# Patient Record
Sex: Female | Born: 1975 | Race: White | Hispanic: Yes | Smoking: Never smoker
Health system: Southern US, Community
[De-identification: ages and names within clinical notes are randomized; demographics above are authoritative.]

## PROBLEM LIST (undated history)

## (undated) DIAGNOSIS — K2970 Gastritis, unspecified, without bleeding: Secondary | ICD-10-CM

## (undated) HISTORY — PX: NO PAST SURGERIES: SHX2092

---

## 1998-07-13 ENCOUNTER — Encounter: Admission: RE | Admit: 1998-07-13 | Discharge: 1998-07-13 | Payer: Self-pay | Admitting: Family Medicine

## 1998-07-28 ENCOUNTER — Encounter: Admission: RE | Admit: 1998-07-28 | Discharge: 1998-07-28 | Payer: Self-pay | Admitting: Family Medicine

## 1998-08-26 ENCOUNTER — Encounter: Admission: RE | Admit: 1998-08-26 | Discharge: 1998-08-26 | Payer: Self-pay | Admitting: Family Medicine

## 2004-11-07 ENCOUNTER — Ambulatory Visit: Payer: Self-pay | Admitting: *Deleted

## 2004-11-08 ENCOUNTER — Ambulatory Visit: Payer: Self-pay | Admitting: Family Medicine

## 2004-11-08 ENCOUNTER — Inpatient Hospital Stay (HOSPITAL_COMMUNITY): Admission: AD | Admit: 2004-11-08 | Discharge: 2004-11-08 | Payer: Self-pay | Admitting: *Deleted

## 2004-11-21 ENCOUNTER — Ambulatory Visit: Payer: Self-pay | Admitting: *Deleted

## 2004-11-22 ENCOUNTER — Inpatient Hospital Stay (HOSPITAL_COMMUNITY): Admission: AD | Admit: 2004-11-22 | Discharge: 2004-11-22 | Payer: Self-pay | Admitting: *Deleted

## 2004-11-28 ENCOUNTER — Ambulatory Visit: Payer: Self-pay | Admitting: *Deleted

## 2004-12-05 ENCOUNTER — Ambulatory Visit: Payer: Self-pay | Admitting: *Deleted

## 2004-12-19 ENCOUNTER — Ambulatory Visit: Payer: Self-pay | Admitting: *Deleted

## 2004-12-26 ENCOUNTER — Ambulatory Visit: Payer: Self-pay | Admitting: *Deleted

## 2005-01-02 ENCOUNTER — Ambulatory Visit: Payer: Self-pay | Admitting: *Deleted

## 2005-01-05 ENCOUNTER — Ambulatory Visit: Payer: Self-pay | Admitting: *Deleted

## 2005-01-05 ENCOUNTER — Inpatient Hospital Stay (HOSPITAL_COMMUNITY): Admission: AD | Admit: 2005-01-05 | Discharge: 2005-01-08 | Payer: Self-pay | Admitting: *Deleted

## 2005-01-06 ENCOUNTER — Encounter (INDEPENDENT_AMBULATORY_CARE_PROVIDER_SITE_OTHER): Payer: Self-pay | Admitting: *Deleted

## 2008-05-07 ENCOUNTER — Inpatient Hospital Stay (HOSPITAL_COMMUNITY): Admission: AD | Admit: 2008-05-07 | Discharge: 2008-05-09 | Payer: Self-pay | Admitting: Obstetrics

## 2011-02-01 ENCOUNTER — Inpatient Hospital Stay (HOSPITAL_COMMUNITY)
Admission: EM | Admit: 2011-02-01 | Discharge: 2011-02-02 | DRG: 392 | Disposition: A | Discharge status: Home / Self Care | Payer: Medicaid Other | Attending: Surgery | Admitting: Surgery

## 2011-02-01 ENCOUNTER — Emergency Department (HOSPITAL_COMMUNITY): Payer: Medicaid Other

## 2011-02-01 DIAGNOSIS — K8020 Calculus of gallbladder without cholecystitis without obstruction: Secondary | ICD-10-CM | POA: Diagnosis present

## 2011-02-01 DIAGNOSIS — A048 Other specified bacterial intestinal infections: Secondary | ICD-10-CM | POA: Diagnosis present

## 2011-02-01 DIAGNOSIS — K2960 Other gastritis without bleeding: Principal | ICD-10-CM | POA: Diagnosis present

## 2011-02-01 LAB — DIFFERENTIAL
Basophils Absolute: 0 10*3/uL (ref 0.0–0.1)
Lymphocytes Relative: 31 % (ref 12–46)
Lymphs Abs: 2.8 10*3/uL (ref 0.7–4.0)
Monocytes Absolute: 0.8 10*3/uL (ref 0.1–1.0)
Monocytes Relative: 9 % (ref 3–12)
Neutro Abs: 5.1 10*3/uL (ref 1.7–7.7)

## 2011-02-01 LAB — COMPREHENSIVE METABOLIC PANEL
ALT: 24 U/L (ref 0–35)
Alkaline Phosphatase: 61 U/L (ref 39–117)
CO2: 26 mEq/L (ref 19–32)
Calcium: 8.8 mg/dL (ref 8.4–10.5)
GFR calc non Af Amer: >60 mL/min (ref >60)
Glucose, Bld: 109 mg/dL — ABNORMAL HIGH (ref 70–99)
Sodium: 138 mEq/L (ref 135–145)

## 2011-02-01 LAB — CBC
HCT: 36.7 % (ref 36.0–46.0)
Hemoglobin: 12.5 g/dL (ref 12.0–15.0)
MCHC: 34.1 g/dL (ref 30.0–36.0)

## 2011-02-01 LAB — LIPASE, BLOOD: Lipase: 28 U/L (ref 11–59)

## 2011-02-01 MED ORDER — TECHNETIUM TC 99M MEBROFENIN IV KIT
5.0000 | PACK | Freq: Once | INTRAVENOUS | Status: AC | PRN
  Administered 2011-02-01: 5 via INTRAVENOUS

## 2011-02-15 NOTE — Discharge Summary (Signed)
[name], [name]                 ACCOUNT NO.:  [id number]  MEDICAL RECORD NO.:  [id number]           PATIENT TYPE:  I  LOCATION:  5125                         FACILITY:  [facility]  PHYSICIAN:  Thornton Park. Daphine Deutscher, MD  DATE OF BIRTH:  02-16-1976  DATE OF ADMISSION:  02/01/2011 DATE OF DISCHARGE:  02/02/2011                              DISCHARGE SUMMARY   HISTORY OF PRESENT ILLNESS:  Katie [name] is a 35 year old Hispanic female who presented with complaint of intermittent abdominal pain that has been going on for actually several years, it has become more frequent recently and sometimes lasting for 3-4 days at a time.  This particular episode that brought her in seemed to start in the middle of the morning while laying in bed.  She states that food or eating does not precipitate or exacerbate her symptoms but she does state that lying flat for long periods of time does seem to exacerbate her symptoms.  She describes the pain as central but radiating around to the back on both sides.  She does report some symptoms related to reflux or indigestion. She denies smoking or tobacco history but Hispanic generally eats foods that are spicier in nature.  She presented to the emergency department where some workup was done including an ultrasound that showed evidence of a possible gallstone adherent in the gallbladder but no wall thickening or other evidence of cholecystitis.  Her labs were completely normal at the time of that workup.  Given her abnormal symptoms, the decision was made to admit the patient for ongoing workup and possible surgical intervention if proven to be biliary colic.  SUMMARY OF HOSPITAL COURSE:  The patient was admitted on February 01, 2011. Additional labs, including amylase and H. pylori, were sent off and a HIDA scan was ordered.  The HIDA scan showed prompt visualization and normal uptake the gallbladder and liver, normal excretion into the small intestine.  Incidentally,  her amylase came back normal; however, her H. pylori titer was positive at 1.22.  Therefore, it had been determined that the patient was probably suffering from an H. pylori gastritis or early ulcerative disease.  She tolerated bowel rest and n.p.o. x24 hours but was started on a low-fat liquid diet here in the hospital which she tolerated well.  At this point, I feel the patient is appropriate to discharge and begin triple medication treatment for H. pylori.  DISCHARGE DIAGNOSIS:  Abdominal pain secondary to Helicobacter pylori gastritis.  DISCHARGE MEDICATIONS: 1. Prilosec 20 mg twice daily. 2. Amoxicillin 500 mg two tablets twice daily. 3. Clarithromycin 500 mg one tablet twice daily.  She is given instructions regarding dietary restrictions, and she is asked to follow up either with our office or her primary care office in the following 2 weeks should her symptoms not resolve.  I have asked her after her completion of treatment, she will likely need to maintain herself on Prilosec or Pepcid over the counter for maintenance.  She also may need eventual referral to Gastroenterology for endoscopic evaluation.     [name], PA-C   ______________________________ Thornton Park Daphine Deutscher, MD  KB/MEDQ  D:  02/02/2011  T:  02/03/2011  Job:  [id number]  Electronically Signed by [name]  on 02/05/2011 02:10:54 PM Electronically Signed by Luretha Murphy MD on 02/15/2011 08:37:18 AM

## 2011-02-15 NOTE — H&P (Signed)
Katie [name], Katie [name]                 ACCOUNT NO.:  [id number]  MEDICAL RECORD NO.:  [id number]           PATIENT TYPE:  E  LOCATION:  [facility]                         FACILITY:  [facility]  PHYSICIAN:  Thornton Park. Daphine Deutscher, MD  DATE OF BIRTH:  [birth year]  DATE OF ADMISSION:  02/01/2011 DATE OF DISCHARGE:                             HISTORY & PHYSICAL   PRIMARY CARE PHYSICIAN:  None.  CHIEF COMPLAINT:  Abdominal pain.  HISTORY OF PRESENT ILLNESS:  Katie [name] is a 35 year old Hispanic female who reports intermittent history of upper abdominal pain actually going on for several years.  For the most part, it is on an intermittent basis several months apart, her most recent bout of this was about 3 weeks ago when the pain came to the upper abdomen, most of the time bothering her in the early morning hours.  This lasted for about 4 days before resolving on its own.  She has had no prior workup for this discomfort. She was awoken again early morning with complaint of upper abdominal and epigastric pain associated with some nausea and vomiting.  She cannot correlate any worsening of these symptoms or precipitated symptoms by eating.  She denies any fever.  She denies any chills.  Denies any chest pain or shortness of breath.  She states the pain does radiate around to her back describing beginning in the central portion of her abdomen, radiating round both sides equally.  She does have some reflux symptoms and states the pain does seem to be worse when lying flat.  She otherwise denies any changes in her stool since diarrhea, constipation or hematochezia or melena.  She has had no prior knowledge of any chronic abdominal disorders.  We have been asked to see the patient and she came into the emergency department this morning her pain has persisted and her ultrasound findings show what could possibly represent a stone in the gallbladder.  Therefore, there is concern for biliary colic and we are asked  to see this patient.  PAST MEDICAL HISTORY:  Negative.  PAST SURGICAL HISTORY:  Negative.  FAMILY HISTORY:  Noncontributory at the present case.  SOCIAL HISTORY:  The patient is married, has one child.  She denies any alcohol, tobacco or illicit drug use.  She denies any chronic nonsteroidal or aspirin therapy.  DRUG ALLERGIES:  No known drug or latex allergies.  MEDICATIONS:  None.  REVIEW OF SYSTEMS:  Please see history of present illness for pertinent findings, otherwise, complete 10 system review negative.  PHYSICAL EXAMINATION:  GENERAL:  A 35 year old obese Hispanic female. VITAL SIGNS:  Temperature of 98.0, heart rate of 70, respiratory rate of 20, blood pressure 120/80. ENT:  Unremarkable. NECK:  Supple with no lymphadenopathy.  Trachea is midline.  No thyromegaly or masses. LUNGS:  Clear to auscultation.  No wheezes, rhonchi or rales.  Normal respiratory effort without use of accessory muscles. HEART:  Regular rate and rhythm.  No murmurs, gallops or rubs.  Carotids 2+ brisk without bruit.  Peripheral pulses intact and symmetrical. ABDOMEN:  Soft, obese but nondistended.  There is no mass effect.  No hernias.  No organomegaly.  The patient is tender in the epigastrium and right upper quadrant without rebound or peritonitis. RECTAL:  Deferred.  Good active range of motion in all extremities without crepitus or pain.  Normal muscle strength and tone without atrophy. SKIN:  Otherwise, warm and dry.  No jaundice.  Normal turgor.  No rashes. NEUROLOGIC:  The patient is alert and oriented x3.  DIAGNOSTICS:  Pertinent lab findings showed a white blood cell count of 8.9.  Metabolic panel shows electrolytes within normal limits.  Liver enzymes including lipase also within normal limits.  Ultrasound showed no gallbladder wall thickening or pericholecystic fluid.  Gallbladder is borderline, distended.  Murphy sign is negative.  There is a 6-mm immobile focus on gallbladder  wall, possibly representing an adhering gallstone.  IMPRESSION:  Abdominal pain suspicious for biliary colic or chronic cholecystitis though her symptoms are not classic and could also represent peptic ulcer disease.  PLAN:  We will admit the patient, repeat some labs including an amylase and an H. pylori titer.  Question the need for HIDA scan versus Gastroenterology consult.  We also discussed with the attending physician, Dr. Luretha Murphy regarding the possibility of a cholecystectomy this admission.     [name], PA-C   ______________________________ Thornton Park Daphine Deutscher, MD    KB/MEDQ  D:  02/01/2011  T:  02/02/2011  Job:  [id number]  Electronically Signed by [name]  on 02/05/2011 02:10:49 PM Electronically Signed by Luretha Murphy MD on 02/15/2011 08:37:20 AM

## 2011-07-13 LAB — RPR: RPR Ser Ql: NONREACTIVE

## 2011-07-13 LAB — CBC
HCT: 33.8 — ABNORMAL LOW
Hemoglobin: 11.3 — ABNORMAL LOW
MCHC: 33.5
MCHC: 33.6
MCV: 93.6
MCV: 94.7
Platelets: 188
RBC: 3.57 — ABNORMAL LOW
RDW: 14.2
WBC: 10.1

## 2012-05-27 ENCOUNTER — Inpatient Hospital Stay (HOSPITAL_COMMUNITY)
Admission: AD | Admit: 2012-05-27 | Discharge: 2012-05-27 | Disposition: A | Discharge status: Home / Self Care | Payer: Medicaid Other | Source: Ambulatory Visit | Attending: Obstetrics & Gynecology | Admitting: Obstetrics & Gynecology

## 2012-05-27 ENCOUNTER — Encounter (HOSPITAL_COMMUNITY): Payer: Self-pay | Admitting: *Deleted

## 2012-05-27 ENCOUNTER — Inpatient Hospital Stay (HOSPITAL_COMMUNITY): Payer: Medicaid Other

## 2012-05-27 DIAGNOSIS — O039 Complete or unspecified spontaneous abortion without complication: Secondary | ICD-10-CM

## 2012-05-27 LAB — CBC
HCT: 37.8 % (ref 36.0–46.0)
MCH: 30.7 pg (ref 26.0–34.0)
MCHC: 34.1 g/dL (ref 30.0–36.0)
MCV: 90 fL (ref 78.0–100.0)
Platelets: 256 10*3/uL (ref 150–400)
RDW: 13.2 % (ref 11.5–15.5)

## 2012-05-27 LAB — POCT PREGNANCY, URINE: Preg Test, Ur: POSITIVE — AB

## 2012-05-27 LAB — WET PREP, GENITAL
Clue Cells Wet Prep HPF POC: NONE SEEN
Trich, Wet Prep: NONE SEEN
Yeast Wet Prep HPF POC: NONE SEEN

## 2012-05-27 MED ORDER — MISOPROSTOL 200 MCG PO TABS: ORAL_TABLET | ORAL | Status: DC

## 2012-05-27 MED ORDER — OXYCODONE-ACETAMINOPHEN 5-325 MG PO TABS: 1.0000 | ORAL_TABLET | ORAL | Status: AC | PRN

## 2012-05-27 NOTE — MAU Provider Note (Signed)
History     CSN: [id number]  Arrival date and time: 05/27/12 1223   None     Chief Complaint  Patient presents with  . Vaginal Bleeding   HPI 37 y.o. [id number] with bleeding x 3 days, spotting last 2 days, heavy with clots and cramping today. Patient's last menstrual period was 03/30/2012. Pt has not taken home UPT.    History reviewed. No pertinent past medical history.  History reviewed. No pertinent past surgical history.  History reviewed. No pertinent family history.  History  Substance Use Topics  . Smoking status: Never Smoker   . Smokeless tobacco: Not on file  . Alcohol Use: No    Allergies: No Known Allergies  Prescriptions prior to admission  Medication Sig Dispense Refill  . ibuprofen (ADVIL,MOTRIN) 200 MG tablet Take 400 mg by mouth once. For pain        Review of Systems  Constitutional: Negative.   Respiratory: Negative.   Cardiovascular: Negative.   Gastrointestinal: Negative for nausea, vomiting, abdominal pain, diarrhea and constipation.  Genitourinary: Negative for dysuria, urgency, frequency, hematuria and flank pain.       Negative vaginal discharge, Positive for vaginal bleeding  Musculoskeletal: Negative.   Neurological: Negative.   Psychiatric/Behavioral: Negative.    Physical Exam   Blood pressure 115/67, pulse 76, temperature 97.8 F (36.6 C), temperature source Oral, resp. rate 18, height 5' 0.5" (1.537 m), weight 168 lb 9.6 oz (76.476 kg), last menstrual period 03/30/2012, SpO2 100.00%.  Physical Exam  Nursing note and vitals reviewed. Constitutional: She is oriented to person, place, and time. She appears well-developed and well-nourished. No distress.  Cardiovascular: Normal rate.   Respiratory: Effort normal.  GI: Soft. There is no tenderness.  Genitourinary: There is no rash, tenderness or lesion on the right labia. There is no rash, tenderness or lesion on the left labia. Uterus is tender. There is bleeding (moderate) around  the vagina.  Musculoskeletal: Normal range of motion.  Neurological: She is alert and oriented to person, place, and time.  Skin: Skin is warm and dry.  Psychiatric: She has a normal mood and affect.    MAU Course  Procedures  Results for orders placed during the hospital encounter of 05/27/12 (from the past 24 hour(s))  CBC     Status: Normal   Collection Time   05/27/12  1:00 PM      Component Value Range   WBC 9.5  4.0 - 10.5 K/uL   RBC 4.20  3.87 - 5.11 MIL/uL   Hemoglobin 12.9  12.0 - 15.0 g/dL   HCT 14.7  82.9 - 56.2 %   MCV 90.0  78.0 - 100.0 fL   MCH 30.7  26.0 - 34.0 pg   MCHC 34.1  30.0 - 36.0 g/dL   RDW 13.0  86.5 - 78.4 %   Platelets 256  150 - 400 K/uL  POCT PREGNANCY, URINE     Status: Abnormal   Collection Time   05/27/12  1:03 PM      Component Value Range   Preg Test, Ur POSITIVE (*) NEGATIVE  HCG, QUANTITATIVE, PREGNANCY     Status: Abnormal   Collection Time   05/27/12  1:26 PM      Component Value Range   hCG, Beta Chain, Quant, S 9094 (*) <5 mIU/mL  ABO/RH     Status: Normal   Collection Time   05/27/12  1:29 PM      Component Value Range   ABO/RH(D) A  POS    WET PREP, GENITAL     Status: Abnormal   Collection Time   05/27/12  2:00 PM      Component Value Range   Yeast Wet Prep HPF POC NONE SEEN  NONE SEEN   Trich, Wet Prep NONE SEEN  NONE SEEN   Clue Cells Wet Prep HPF POC NONE SEEN  NONE SEEN   WBC, Wet Prep HPF POC RARE (*) NONE SEEN   U/S: no IUP or adnexal mass, fluid/clot in cervix  Assessment and Plan  36 y.o. [id number] at [redacted] weeks EGA by LMP SAB - per Dr. Erin Fulling recommend cytotec, rx given for home admin per patient preference, f/u in 48 hours for repeat quant Precautions rev'd Rx Percocet for pain, continue motrin PRN  Katie [name] 05/27/2012, 4:24 PM        Early Intrauterine Pregnancy Failure  __x_  Documented intrauterine pregnancy failure less than or equal to [redacted] weeks gestation  __x_  No serious current illness  __x_   Baseline Hgb greater than or equal to 10g/dl  __x_  Patient has easily accessible transportation to the hospital  __x_  Clear preference  __x_  Practitioner/physician deems patient reliable  _x__  Counseling by practitioner or physician  __x_  Patient education by RN  __n/a_  Rho-Gam given by RN if indicated  _x__ Medication dispensed   __x_   Cytotec 800 mcg  _x_   Intravaginally by patient at home         __   Intravaginally by RN in MAU        __   Rectally by patient at home        __   Rectally by RN in MAU  _x__  Ibuprofen 600 mg 1 tablet by mouth every 6 hours as needed #30  _x__  Oxycodone/acetaminophen 5/325 mg by mouth every 4 to 6 hours as needed

## 2012-05-27 NOTE — MAU Note (Signed)
Patient states she started having bleeding 3 days ago and now heavier and passing clots. Having lower abdominal and low back pain. Last period 6-16.

## 2012-05-27 NOTE — MAU Provider Note (Signed)
Attestation of Attending Supervision of Advanced Practitioner (CNM/NP): Evaluation and management procedures were performed by the Advanced Practitioner under my supervision and collaboration.  I have reviewed the Advanced Practitioner's note and chart, and I agree with the management and plan.  HARRAWAY-SMITH, Natascha Edmonds 4:56 PM     

## 2012-05-29 ENCOUNTER — Inpatient Hospital Stay (HOSPITAL_COMMUNITY)
Admission: AD | Admit: 2012-05-29 | Discharge: 2012-05-29 | Disposition: A | Discharge status: Home / Self Care | Payer: Medicaid Other | Source: Ambulatory Visit | Attending: Obstetrics & Gynecology | Admitting: Obstetrics & Gynecology

## 2012-05-29 DIAGNOSIS — O039 Complete or unspecified spontaneous abortion without complication: Secondary | ICD-10-CM

## 2012-05-29 LAB — HCG, QUANTITATIVE, PREGNANCY: hCG, Beta Chain, Quant, S: 1180 m[IU]/mL — ABNORMAL HIGH (ref <5)

## 2012-05-29 NOTE — MAU Note (Signed)
Per Maday, spanish interpreter, still having pain, but goes away with ibuprofen. Bleeding like a menstrual cycle. No pain at present. Last took ibuprofen at 1300. Has changed pad twice today. Still notes blood clots with voiding.

## 2012-05-29 NOTE — MAU Provider Note (Signed)
  History     CSN: [id number]  Arrival date and time: 05/29/12 1630   None     No chief complaint on file.  HPI 36 y.o. [id number] seen on 8/13 with SAB, quant 9000 at that time, sent home with cytotec. States bleeding is now like a "normal period". Cramping controlled with pain meds.    No past medical history on file.  No past surgical history on file.  No family history on file.  History  Substance Use Topics  . Smoking status: Never Smoker   . Smokeless tobacco: Not on file  . Alcohol Use: No    Allergies: No Known Allergies  Prescriptions prior to admission  Medication Sig Dispense Refill  . ibuprofen (ADVIL,MOTRIN) 200 MG tablet Take 400 mg by mouth once. For pain      . misoprostol (CYTOTEC) 200 MCG tablet Take 800 mg (4 tablets) vaginally x 1  4 tablet  0  . oxyCODONE-acetaminophen (PERCOCET/ROXICET) 5-325 MG per tablet Take 1 tablet by mouth every 4 (four) hours as needed for pain.  20 tablet  0    Review of Systems  Constitutional: Negative.   Respiratory: Negative.   Cardiovascular: Negative.   Gastrointestinal: Negative.   Genitourinary: Negative.   Neurological: Negative.    Physical Exam   Blood pressure 128/68, pulse 69, temperature 98 F (36.7 C), temperature source Oral, resp. rate 16, height 5' 0.5" (1.537 m), weight 167 lb 4 oz (75.864 kg), last menstrual period 03/30/2012.  Physical Exam  Vitals reviewed. Constitutional: She is oriented to person, place, and time. She appears well-developed and well-nourished. No distress.  Cardiovascular: Normal rate.   Respiratory: Effort normal.  Musculoskeletal: Normal range of motion.  Neurological: She is alert and oriented to person, place, and time.  Skin: Skin is warm and dry.  Psychiatric: She has a normal mood and affect.    MAU Course  Procedures Results for orders placed during the hospital encounter of 05/29/12 (from the past 72 hour(s))  HCG, QUANTITATIVE, PREGNANCY     Status: Abnormal   Collection Time   05/29/12  4:44 PM      Component Value Range Comment   hCG, Beta Chain, Quant, S 1180 (*) <5 mIU/mL      Assessment and Plan  36 y.o. [id number] SAB - appropriate decrease in HCG, f/u 1 week in clinic for repeat quant Precautions rev'd  Saabir Blyth 05/29/2012, 6:09 PM

## 2012-06-01 NOTE — MAU Provider Note (Signed)
Medical Screening exam and patient care preformed by advanced practice provider.  Agree with the above management.  

## 2012-06-06 ENCOUNTER — Other Ambulatory Visit: Payer: Medicaid Other

## 2012-06-06 DIAGNOSIS — O039 Complete or unspecified spontaneous abortion without complication: Secondary | ICD-10-CM

## 2013-03-04 ENCOUNTER — Emergency Department (HOSPITAL_COMMUNITY)
Admission: EM | Admit: 2013-03-04 | Discharge: 2013-03-04 | Disposition: A | Discharge status: Other Acute Inpatient Hospital | Payer: Self-pay | Source: Home / Self Care | Attending: Emergency Medicine | Admitting: Emergency Medicine

## 2013-03-04 ENCOUNTER — Encounter (HOSPITAL_COMMUNITY): Payer: Self-pay | Admitting: Adult Health

## 2013-03-04 ENCOUNTER — Encounter (HOSPITAL_COMMUNITY): Payer: Self-pay | Admitting: *Deleted

## 2013-03-04 DIAGNOSIS — R109 Unspecified abdominal pain: Secondary | ICD-10-CM

## 2013-03-04 DIAGNOSIS — Z87891 Personal history of nicotine dependence: Secondary | ICD-10-CM | POA: Insufficient documentation

## 2013-03-04 DIAGNOSIS — R1011 Right upper quadrant pain: Secondary | ICD-10-CM

## 2013-03-04 LAB — CBC WITH DIFFERENTIAL/PLATELET
Eosinophils Relative: 1 % (ref 0–5)
Lymphocytes Relative: 33 % (ref 12–46)
Lymphs Abs: 3.3 10*3/uL (ref 0.7–4.0)
MCV: 89.3 fL (ref 78.0–100.0)
Neutro Abs: 5.7 10*3/uL (ref 1.7–7.7)
Platelets: 323 10*3/uL (ref 150–400)
RBC: 4.6 MIL/uL (ref 3.87–5.11)
WBC: 9.8 10*3/uL (ref 4.0–10.5)

## 2013-03-04 LAB — URINALYSIS, ROUTINE W REFLEX MICROSCOPIC
Glucose, UA: NEGATIVE mg/dL
Ketones, ur: NEGATIVE mg/dL
Nitrite: NEGATIVE
Specific Gravity, Urine: 1.024 (ref 1.005–1.030)
pH: 6 (ref 5.0–8.0)

## 2013-03-04 LAB — POCT URINALYSIS DIP (DEVICE)
Leukocytes, UA: NEGATIVE
Nitrite: NEGATIVE
Protein, ur: NEGATIVE mg/dL
pH: 7 (ref 5.0–8.0)

## 2013-03-04 LAB — URINE MICROSCOPIC-ADD ON

## 2013-03-04 LAB — COMPREHENSIVE METABOLIC PANEL
Albumin: 4.3 g/dL (ref 3.5–5.2)
BUN: 8 mg/dL (ref 6–23)
Chloride: 101 mEq/L (ref 96–112)
Creatinine, Ser: 0.51 mg/dL (ref 0.50–1.10)
GFR calc Af Amer: >90 mL/min (ref >90)
Total Bilirubin: 0.2 mg/dL — ABNORMAL LOW (ref 0.3–1.2)
Total Protein: 8.4 g/dL — ABNORMAL HIGH (ref 6.0–8.3)

## 2013-03-04 LAB — POCT PREGNANCY, URINE: Preg Test, Ur: NEGATIVE

## 2013-03-04 NOTE — ED Notes (Addendum)
Pt   Reports   Pain   r    Hip/  r  Leg  Pain  Radiating  Downward     Pt  Reports  Pain is  Worse  On  Movement and  Position  denys  Any  Urinary     Symptoms    Ambulated  To  Room with a  Steady  Fluid  Gait  Pain is  r  Side  r  Flank  Area   As  Well    denys  Any  specefic  Recent  injury

## 2013-03-04 NOTE — ED Notes (Signed)
Presents with right lower abdominal pain that radiates from right flank. Pain began Monday and has gotten worse. Described as "contractions" pain began in right flank and has moved to right lower quadrant. Denies nausea, denies urinary symtpoms denies vaginal discharge.

## 2013-03-04 NOTE — ED Provider Notes (Signed)
And a As the History     CSN: [id number]  Arrival date & time 03/04/13  1806   First MD Initiated Contact with Patient 03/04/13 1906      Chief Complaint  Patient presents with  . Flank Pain    (Consider location/radiation/quality/duration/timing/severity/associated sxs/prior treatment) HPI Comments: Patient presents urgent care this evening complaining of ongoing abdominal pain and back pain that started Monday. She describes the pain starts in her right upper abdominal area radiates towards her back and lower abdomen. Movement and activity makes pain worse. She denies any fevers, vomiting, diarrheas, or urinary symptoms as in increased frequency, discomfort or burning and pressure with urination. Patient describes that she has not had anything to sleep since this morning except for a banana that ate about 1 hr ago. Pain is bothering her a bit more the last few hours and now is unrelated to movement or activity ( patient points to right upper quadrant region).  Patient is a 37 y.o. female presenting with flank pain. The history is provided by the patient and the spouse.  Flank Pain This is a new problem. The current episode started more than 2 days ago. The problem occurs constantly. The problem has been gradually worsening. Associated symptoms include abdominal pain. Pertinent negatives include no headaches and no shortness of breath. Nothing aggravates the symptoms. The treatment provided no relief.    History reviewed. No pertinent past medical history.  History reviewed. No pertinent past surgical history.  History reviewed. No pertinent family history.  History  Substance Use Topics  . Smoking status: Never Smoker   . Smokeless tobacco: Not on file  . Alcohol Use: No    OB History   Grav Para Term Preterm Abortions TAB SAB Ect Mult Living   3 3 3  0 0 0 0 0 0 3      Review of Systems  Constitutional: Positive for appetite change. Negative for fever, chills, diaphoresis  and unexpected weight change.  Respiratory: Negative for cough and shortness of breath.   Gastrointestinal: Positive for abdominal pain. Negative for nausea, vomiting, diarrhea, constipation, blood in stool, abdominal distention, anal bleeding and rectal pain.  Genitourinary: Negative for dysuria, frequency, hematuria, flank pain, vaginal bleeding, vaginal discharge, vaginal pain and pelvic pain.  Musculoskeletal: Positive for back pain. Negative for myalgias, joint swelling and gait problem.  Skin: Negative for color change and rash.  Neurological: Negative for headaches.    Allergies  Review of patient's allergies indicates no known allergies.  Home Medications   Current Outpatient Rx  Name  Route  Sig  Dispense  Refill  . ibuprofen (ADVIL,MOTRIN) 200 MG tablet   Oral   Take 400 mg by mouth once. For pain         . misoprostol (CYTOTEC) 200 MCG tablet      Take 800 mg (4 tablets) vaginally x 1   4 tablet   0     Instructions in Spanish, please     BP 128/80  Pulse 72  Temp(Src) 98.6 F (37 C) (Oral)  Resp 18  SpO2 98%  Physical Exam  Nursing note and vitals reviewed. Constitutional: She appears well-developed. She appears distressed.  Pulmonary/Chest: Breath sounds normal.  Abdominal: Soft. She exhibits no distension. There is no hepatosplenomegaly or hepatomegaly. There is tenderness in the right upper quadrant and epigastric area. There is positive Murphy's sign. There is no rigidity, no rebound, no guarding, no CVA tenderness and no tenderness at McBurney's point.  Musculoskeletal:  She exhibits no tenderness.  Neurological: She is alert.  Skin: No rash noted. No erythema.    ED Course  Procedures (including critical care time)  Labs Reviewed  POCT URINALYSIS DIP (DEVICE)  POCT PREGNANCY, URINE   No results found.   1. Abdominal pain   2. Right upper quadrant abdominal pain     Bedside hand-held ultrasound- performed ( non-your diagnostic). Partially  collapsed gallbladder observed. Clear more morrison's pouch-  MDM  37 y/o presents c/o of RUQ- radiation to back on and R lower abdominal pain. Exam was somewhat consistent with right upper quadrant and a positive Murphy's sign. Patient is afebrile, denies any urinary symptoms or associated gastrointestinal symptoms. Based on abdominal exam and patient reporting worsening symptoms we'll consider transferring patient to the emergency department to be consider for right upper quadrant ultrasound.   Jimmie Molly, MD 03/04/13 2013

## 2013-03-05 ENCOUNTER — Emergency Department (HOSPITAL_COMMUNITY)
Admission: EM | Admit: 2013-03-05 | Discharge: 2013-03-05 | Disposition: A | Discharge status: Home / Self Care | Payer: Self-pay | Attending: Emergency Medicine | Admitting: Emergency Medicine

## 2013-03-05 ENCOUNTER — Emergency Department (HOSPITAL_COMMUNITY): Payer: Self-pay

## 2013-03-05 DIAGNOSIS — R109 Unspecified abdominal pain: Secondary | ICD-10-CM

## 2013-03-05 MED ORDER — OXYCODONE-ACETAMINOPHEN 5-325 MG PO TABS
1.0000 | ORAL_TABLET | Freq: Once | ORAL | Status: AC
  Administered 2013-03-05: 1 via ORAL
  Filled 2013-03-05: qty 1

## 2013-03-05 MED ORDER — OXYCODONE-ACETAMINOPHEN 5-325 MG PO TABS: 1.0000 | ORAL_TABLET | Freq: Four times a day (QID) | ORAL | Status: DC | PRN

## 2013-03-05 NOTE — ED Notes (Signed)
Pt. Returned from CT.

## 2013-03-05 NOTE — ED Provider Notes (Signed)
History     CSN: [id number]  Arrival date & time 03/04/13  2001   First MD Initiated Contact with Patient 03/05/13 0056      Chief Complaint  Patient presents with  . Abdominal Pain    (Consider location/radiation/quality/duration/timing/severity/associated sxs/prior treatment) Patient is a 37 y.o. female presenting with abdominal pain and flank pain. The history is provided by the patient.  Abdominal Pain This is a new problem. The current episode started more than 2 days ago. The problem occurs daily. The problem has not changed since onset.Associated symptoms include abdominal pain. Pertinent negatives include no chest pain and no headaches. Nothing aggravates the symptoms. Nothing relieves the symptoms. She has tried nothing for the symptoms. The treatment provided no relief.  Flank Pain This is a new problem. The current episode started more than 2 days ago. The problem occurs daily. The problem has not changed since onset.Associated symptoms include abdominal pain. Pertinent negatives include no chest pain and no headaches. Nothing aggravates the symptoms. Nothing relieves the symptoms. She has tried nothing for the symptoms. The treatment provided no relief.  Patient is describiing clearly describing flank pain and inguinal pain and not RUQ pain.   History reviewed. No pertinent past medical history.  History reviewed. No pertinent past surgical history.  History reviewed. No pertinent family history.  History  Substance Use Topics  . Smoking status: Never Smoker   . Smokeless tobacco: Not on file  . Alcohol Use: No    OB History   Grav Para Term Preterm Abortions TAB SAB Ect Mult Living   3 3 3  0 0 0 0 0 0 3      Review of Systems  Cardiovascular: Negative for chest pain.  Gastrointestinal: Positive for abdominal pain. Negative for nausea, vomiting and diarrhea.  Genitourinary: Positive for flank pain.  Neurological: Negative for headaches.  All other systems  reviewed and are negative.    Allergies  Review of patient's allergies indicates no known allergies.  Home Medications   Current Outpatient Rx  Name  Route  Sig  Dispense  Refill  . ibuprofen (ADVIL,MOTRIN) 200 MG tablet   Oral   Take 400 mg by mouth every 8 (eight) hours as needed for pain.            BP 116/80  Pulse 72  Temp(Src) 98.3 F (36.8 C) (Oral)  Resp 16  SpO2 99%  Physical Exam  Constitutional: She is oriented to person, place, and time. She appears well-developed and well-nourished. No distress.  HENT:  Head: Normocephalic and atraumatic.  Mouth/Throat: Oropharynx is clear and moist.  Eyes: Conjunctivae are normal. Pupils are equal, round, and reactive to light.  Neck: Normal range of motion. Neck supple.  Cardiovascular: Normal rate, regular rhythm and intact distal pulses.   Pulmonary/Chest: Effort normal and breath sounds normal. She has no wheezes. She has no rales.  Abdominal: Soft. Bowel sounds are normal. There is no tenderness. There is no rebound and no guarding.  Musculoskeletal: Normal range of motion.  Neurological: She is alert and oriented to person, place, and time.  Skin: Skin is warm and dry.  Psychiatric: She has a normal mood and affect.    ED Course  Procedures (including critical care time)  Labs Reviewed  COMPREHENSIVE METABOLIC PANEL - Abnormal; Notable for the following:    Total Protein 8.4 (*)    Total Bilirubin 0.2 (*)    All other components within normal limits  URINALYSIS, ROUTINE W REFLEX MICROSCOPIC -  Abnormal; Notable for the following:    APPearance CLOUDY (*)    Leukocytes, UA TRACE (*)    All other components within normal limits  CBC WITH DIFFERENTIAL  URINE MICROSCOPIC-ADD ON  POCT PREGNANCY, URINE   No results found.   No diagnosis found.    MDM  There is no RUQ tenderness on exam.  Suspect muscle spasm.  Pain is low.  Denies all urinary and vaginal symptoms will prescribe pain medication and close  follow up return for fevers, intractable vomiting or pain or any concerns.          Jasmine Awe, MD 03/05/13 [phone]

## 2013-09-23 ENCOUNTER — Ambulatory Visit: Payer: Self-pay | Admitting: Family Medicine

## 2013-09-23 VITALS — BP 122/78 | HR 80 | Temp 98.0°F | Resp 17 | Ht 60.5 in | Wt 167.0 lb

## 2013-09-23 DIAGNOSIS — N926 Irregular menstruation, unspecified: Secondary | ICD-10-CM

## 2013-09-23 DIAGNOSIS — Z331 Pregnant state, incidental: Secondary | ICD-10-CM

## 2013-09-23 DIAGNOSIS — Z3490 Encounter for supervision of normal pregnancy, unspecified, unspecified trimester: Secondary | ICD-10-CM

## 2013-09-23 DIAGNOSIS — N949 Unspecified condition associated with female genital organs and menstrual cycle: Secondary | ICD-10-CM

## 2013-09-23 LAB — POCT URINE PREGNANCY: Preg Test, Ur: POSITIVE

## 2013-09-23 MED ORDER — PNV PRENATAL PLUS MULTIVITAMIN 27-1 MG PO TABS: 1.0000 | ORAL_TABLET | Freq: Every day | ORAL | Status: DC

## 2013-09-23 NOTE — Progress Notes (Addendum)
Subjective:    Patient ID: Ariyon Gerstenberger, female    DOB: 04/30/1973, 37 y.o.   MRN: [id number] This chart was scribed for Meredith Staggers, MD by Nicholos Johns, Medical Scribe. This patient's care was started at 11:45 AM.   HPI HPI COMMENTS:  Pammie Chirino is 37 y.o. female who presents to the office in need of a pregnancy test. LNMP was the 9th of September. Prior menses was on the 2nd of August. Took test at home in October that came back positive. Does not have OBGYN. Pt wants to take a pregnancy test so she can seek routine prenatal care. Participates in adopt-a-mom program which needs pregnancy test. Pt states pregnancy was not planned but she plans to continue to full term. Pt states she has gastritis. Based on LNMP, gestational age is 1 weeks & 1 day and est. due date of June 16th. Pt has been taking over the counter prenatal vitamins for about 1 month. Denies abdominal cramps or vaginal bleeding. Denies any other acute symptoms or past medical history.    There are no active problems to display for this patient.  No past medical history on file. No past surgical history on file. No Known Allergies Prior to Admission medications   Not on File   History   Social History  . Marital Status: Unknown    Spouse Name: N/A    Number of Children: N/A  . Years of Education: N/A   Occupational History  . Not on file.   Social History Main Topics  . Smoking status: Never Smoker   . Smokeless tobacco: Not on file  . Alcohol Use: Not on file  . Drug Use: Not on file  . Sexual Activity: Not on file   Other Topics Concern  . Not on file   Social History Narrative  . No narrative on file         Review of Systems  Gastrointestinal: Negative for abdominal pain.  Genitourinary: Negative for vaginal bleeding.       Objective:   Physical Exam  Vitals reviewed. Constitutional: She is oriented to person, place, and time. She appears well-developed and well-nourished. No  distress.  HENT:  Head: Normocephalic and atraumatic.  Eyes: EOM are normal.  Neck: Neck supple. No tracheal deviation present.  Cardiovascular: Normal rate, regular rhythm and normal heart sounds.  Exam reveals no gallop and no friction rub.   No murmur heard. Pulmonary/Chest: Effort normal and breath sounds normal. No respiratory distress.  Abdominal: Soft. There is no tenderness.  Possible fundus palpated 3 cm below umbillicus  Musculoskeletal: Normal range of motion.  Neurological: She is alert and oriented to person, place, and time.  Skin: Skin is warm and dry.  Psychiatric: She has a normal mood and affect. Her behavior is normal.    Filed Vitals:   09/23/13 0957  BP: 122/78  Pulse: 80  Temp: 98 F (36.7 C)  TempSrc: Oral  Resp: 17  Height: 5' 0.5" (1.537 m)  Weight: 167 lb (75.751 kg)  SpO2: 98%   Results for orders placed in visit on 09/23/13  POCT URINE PREGNANCY      Result Value Range   Preg Test, Ur Positive          Assessment & Plan:  Allyanna Appleman is a 37 y.o. female Late menses - Plan: POCT urine pregnancy  Pregnancy  Copy of AVS with positive test provided to call adopt a mom. Start PNV with 1mg  folic acid. rtc  precautions. Discussed in spanish and interpreter present.   No orders of the defined types were placed in this encounter.   Patient Instructions  Empiece nuevas vitaminas prenatales, llame " Adopt a Mom".  Basado en su ultima regla, tu tienes 13 semanas y un dia, pero lo mas probable es que hagan un ulrasonido para la Engineer, manufacturing.  Si  tiene algun problema vaya al hospital [facility].

## 2013-09-23 NOTE — Patient Instructions (Addendum)
Empiece nuevas vitaminas prenatales, llame " Adopt a Mom": Adopt-A-Mom Patient Coordinator [phone].   Basado en su ultima regla, tu tienes 13 semanas y un dia, pero lo mas probable es que hagan un ulrasonido para la Engineer, manufacturing.  Si  tiene algun problema vaya al hospital [facility].

## 2013-09-24 ENCOUNTER — Encounter (HOSPITAL_COMMUNITY): Payer: Self-pay | Admitting: Adult Health

## 2013-10-15 NOTE — L&D Delivery Note (Signed)
Delivery Note At 1:36 PM a viable female was delivered via Vaginal, Spontaneous Delivery (Presentation: Left Occiput Anterior).  APGAR: 9, 9; weight .   Placenta status: Intact, Manual removal.  Cord: 3 vessels with the following complications: None.  Cord pH: not done  Anesthesia: Epidural  Episiotomy:  Lacerations: 2nd degree;Perineal Suture Repair: 2.0 vicryl Est. Blood Loss (mL): 250  Mom to postpartum.  Baby to Couplet care / Skin to Skin.  Kathreen Cosier 03/22/2014, 1:47 PM

## 2013-10-22 LAB — OB RESULTS CONSOLE ABO/RH: RH Type: POSITIVE

## 2013-10-22 LAB — OB RESULTS CONSOLE RUBELLA ANTIBODY, IGM: Rubella: IMMUNE

## 2013-10-22 LAB — OB RESULTS CONSOLE RPR: RPR: NONREACTIVE

## 2013-10-22 LAB — OB RESULTS CONSOLE HEPATITIS B SURFACE ANTIGEN: HEP B S AG: NEGATIVE

## 2013-10-22 LAB — OB RESULTS CONSOLE ANTIBODY SCREEN: ANTIBODY SCREEN: NEGATIVE

## 2013-10-22 LAB — OB RESULTS CONSOLE HIV ANTIBODY (ROUTINE TESTING): HIV: NONREACTIVE

## 2013-10-22 LAB — OB RESULTS CONSOLE GC/CHLAMYDIA
Chlamydia: NEGATIVE
Gonorrhea: NEGATIVE

## 2014-03-08 LAB — OB RESULTS CONSOLE GBS: STREP GROUP B AG: POSITIVE

## 2014-03-22 ENCOUNTER — Inpatient Hospital Stay (HOSPITAL_COMMUNITY): Payer: Managed Care, Other (non HMO) | Admitting: Anesthesiology

## 2014-03-22 ENCOUNTER — Encounter (HOSPITAL_COMMUNITY): Payer: Managed Care, Other (non HMO) | Admitting: Anesthesiology

## 2014-03-22 ENCOUNTER — Encounter (HOSPITAL_COMMUNITY): Payer: Self-pay

## 2014-03-22 ENCOUNTER — Inpatient Hospital Stay (HOSPITAL_COMMUNITY)
Admission: AD | Admit: 2014-03-22 | Discharge: 2014-03-23 | DRG: 774 | Disposition: A | Discharge status: Home / Self Care | Payer: Managed Care, Other (non HMO) | Source: Ambulatory Visit | Attending: Obstetrics | Admitting: Obstetrics

## 2014-03-22 DIAGNOSIS — O09529 Supervision of elderly multigravida, unspecified trimester: Secondary | ICD-10-CM | POA: Diagnosis present

## 2014-03-22 DIAGNOSIS — O479 False labor, unspecified: Secondary | ICD-10-CM | POA: Diagnosis present

## 2014-03-22 HISTORY — DX: Gastritis, unspecified, without bleeding: K29.70

## 2014-03-22 LAB — TYPE AND SCREEN
ABO/RH(D): A POS
Antibody Screen: NEGATIVE

## 2014-03-22 LAB — CBC
HCT: 37.8 % (ref 36.0–46.0)
Hemoglobin: 13.4 g/dL (ref 12.0–15.0)
MCH: 32.6 pg (ref 26.0–34.0)
MCHC: 35.4 g/dL (ref 30.0–36.0)
MCV: 92 fL (ref 78.0–100.0)
PLATELETS: 191 10*3/uL (ref 150–400)
RBC: 4.11 MIL/uL (ref 3.87–5.11)
RDW: 13.8 % (ref 11.5–15.5)
WBC: 10.3 10*3/uL (ref 4.0–10.5)

## 2014-03-22 LAB — RPR

## 2014-03-22 MED ORDER — IBUPROFEN 600 MG PO TABS
600.0000 mg | ORAL_TABLET | Freq: Four times a day (QID) | ORAL | Status: DC
  Administered 2014-03-22 – 2014-03-23 (×4): 600 mg via ORAL
  Filled 2014-03-22 (×4): qty 1

## 2014-03-22 MED ORDER — LACTATED RINGERS IV SOLN
INTRAVENOUS | Status: DC
  Administered 2014-03-22 (×2): via INTRAVENOUS

## 2014-03-22 MED ORDER — LIDOCAINE HCL (PF) 1 % IJ SOLN
30.0000 mL | INTRAMUSCULAR | Status: DC | PRN
  Filled 2014-03-22: qty 30

## 2014-03-22 MED ORDER — FERROUS SULFATE 325 (65 FE) MG PO TABS
325.0000 mg | ORAL_TABLET | Freq: Two times a day (BID) | ORAL | Status: DC
  Administered 2014-03-22 – 2014-03-23 (×2): 325 mg via ORAL
  Filled 2014-03-22 (×2): qty 1

## 2014-03-22 MED ORDER — BUTORPHANOL TARTRATE 1 MG/ML IJ SOLN
1.0000 mg | INTRAMUSCULAR | Status: DC | PRN
  Administered 2014-03-22: 1 mg via INTRAVENOUS
  Filled 2014-03-22: qty 1

## 2014-03-22 MED ORDER — SENNOSIDES-DOCUSATE SODIUM 8.6-50 MG PO TABS
2.0000 | ORAL_TABLET | ORAL | Status: DC
  Administered 2014-03-22: 2 via ORAL
  Filled 2014-03-22: qty 2

## 2014-03-22 MED ORDER — DIPHENHYDRAMINE HCL 25 MG PO CAPS: 25.0000 mg | ORAL_CAPSULE | Freq: Four times a day (QID) | ORAL | Status: DC | PRN

## 2014-03-22 MED ORDER — ZOLPIDEM TARTRATE 5 MG PO TABS: 5.0000 mg | ORAL_TABLET | Freq: Every evening | ORAL | Status: DC | PRN

## 2014-03-22 MED ORDER — SODIUM CHLORIDE 0.9 % IV SOLN: 14.0000 mL/h | INTRAVENOUS | Status: DC | PRN

## 2014-03-22 MED ORDER — CITRIC ACID-SODIUM CITRATE 334-500 MG/5ML PO SOLN: 30.0000 mL | ORAL | Status: DC | PRN

## 2014-03-22 MED ORDER — OXYCODONE-ACETAMINOPHEN 5-325 MG PO TABS: 1.0000 | ORAL_TABLET | ORAL | Status: DC | PRN

## 2014-03-22 MED ORDER — TETANUS-DIPHTH-ACELL PERTUSSIS 5-2.5-18.5 LF-MCG/0.5 IM SUSP
0.5000 mL | Freq: Once | INTRAMUSCULAR | Status: DC
Start: 2014-03-23 — End: 2014-03-23

## 2014-03-22 MED ORDER — ONDANSETRON HCL 4 MG PO TABS: 4.0000 mg | ORAL_TABLET | ORAL | Status: DC | PRN

## 2014-03-22 MED ORDER — LANOLIN HYDROUS EX OINT: TOPICAL_OINTMENT | CUTANEOUS | Status: DC | PRN

## 2014-03-22 MED ORDER — BENZOCAINE-MENTHOL 20-0.5 % EX AERO
1.0000 "application " | INHALATION_SPRAY | CUTANEOUS | Status: DC | PRN
  Administered 2014-03-22: 1 via TOPICAL
  Filled 2014-03-22: qty 56

## 2014-03-22 MED ORDER — WITCH HAZEL-GLYCERIN EX PADS: 1.0000 "application " | MEDICATED_PAD | CUTANEOUS | Status: DC | PRN

## 2014-03-22 MED ORDER — PHENYLEPHRINE 40 MCG/ML (10ML) SYRINGE FOR IV PUSH (FOR BLOOD PRESSURE SUPPORT)
80.0000 ug | PREFILLED_SYRINGE | INTRAVENOUS | Status: DC | PRN
  Filled 2014-03-22: qty 10
  Filled 2014-03-22: qty 2

## 2014-03-22 MED ORDER — PENICILLIN G POTASSIUM 5000000 UNITS IJ SOLR
2.5000 10*6.[IU] | INTRAVENOUS | Status: DC
  Administered 2014-03-22 (×2): 2.5 10*6.[IU] via INTRAVENOUS
  Filled 2014-03-22 (×6): qty 2.5

## 2014-03-22 MED ORDER — ONDANSETRON HCL 4 MG/2ML IJ SOLN: 4.0000 mg | INTRAMUSCULAR | Status: DC | PRN

## 2014-03-22 MED ORDER — EPHEDRINE 5 MG/ML INJ
10.0000 mg | INTRAVENOUS | Status: DC | PRN
  Filled 2014-03-22: qty 4
  Filled 2014-03-22: qty 2

## 2014-03-22 MED ORDER — FLEET ENEMA 7-19 GM/118ML RE ENEM: 1.0000 | ENEMA | RECTAL | Status: DC | PRN

## 2014-03-22 MED ORDER — PRENATAL MULTIVITAMIN CH
1.0000 | ORAL_TABLET | Freq: Every day | ORAL | Status: DC
  Administered 2014-03-23: 1 via ORAL
  Filled 2014-03-22: qty 1

## 2014-03-22 MED ORDER — OXYTOCIN 40 UNITS IN LACTATED RINGERS INFUSION - SIMPLE MED
62.5000 mL/h | INTRAVENOUS | Status: DC
  Administered 2014-03-22: 999 mL/h via INTRAVENOUS
  Filled 2014-03-22: qty 1000

## 2014-03-22 MED ORDER — LACTATED RINGERS IV SOLN
500.0000 mL | Freq: Once | INTRAVENOUS | Status: AC
  Administered 2014-03-22: 500 mL via INTRAVENOUS

## 2014-03-22 MED ORDER — OXYTOCIN BOLUS FROM INFUSION: 500.0000 mL | INTRAVENOUS | Status: DC

## 2014-03-22 MED ORDER — LIDOCAINE HCL (PF) 1 % IJ SOLN
INTRAMUSCULAR | Status: DC | PRN
  Administered 2014-03-22 (×2): 4 mL

## 2014-03-22 MED ORDER — ACETAMINOPHEN 325 MG PO TABS: 650.0000 mg | ORAL_TABLET | ORAL | Status: DC | PRN

## 2014-03-22 MED ORDER — DIBUCAINE 1 % RE OINT: 1.0000 "application " | TOPICAL_OINTMENT | RECTAL | Status: DC | PRN

## 2014-03-22 MED ORDER — PENICILLIN G POTASSIUM 5000000 UNITS IJ SOLR
5.0000 10*6.[IU] | Freq: Once | INTRAMUSCULAR | Status: AC
  Administered 2014-03-22: 5 10*6.[IU] via INTRAVENOUS
  Filled 2014-03-22: qty 5

## 2014-03-22 MED ORDER — FENTANYL 2.5 MCG/ML BUPIVACAINE 1/10 % EPIDURAL INFUSION (WH - ANES)
14.0000 mL/h | INTRAMUSCULAR | Status: DC | PRN
  Filled 2014-03-22: qty 125

## 2014-03-22 MED ORDER — SIMETHICONE 80 MG PO CHEW: 80.0000 mg | CHEWABLE_TABLET | ORAL | Status: DC | PRN

## 2014-03-22 MED ORDER — LACTATED RINGERS IV SOLN: 500.0000 mL | INTRAVENOUS | Status: DC | PRN

## 2014-03-22 MED ORDER — EPHEDRINE 5 MG/ML INJ
10.0000 mg | INTRAVENOUS | Status: DC | PRN
  Filled 2014-03-22: qty 2

## 2014-03-22 MED ORDER — PHENYLEPHRINE 40 MCG/ML (10ML) SYRINGE FOR IV PUSH (FOR BLOOD PRESSURE SUPPORT)
80.0000 ug | PREFILLED_SYRINGE | INTRAVENOUS | Status: DC | PRN
  Filled 2014-03-22: qty 2

## 2014-03-22 MED ORDER — DIPHENHYDRAMINE HCL 50 MG/ML IJ SOLN: 12.5000 mg | INTRAMUSCULAR | Status: DC | PRN

## 2014-03-22 MED ORDER — FENTANYL 2.5 MCG/ML BUPIVACAINE 1/10 % EPIDURAL INFUSION (WH - ANES)
INTRAMUSCULAR | Status: DC | PRN
  Administered 2014-03-22: 14 mL/h via EPIDURAL

## 2014-03-22 MED ORDER — IBUPROFEN 600 MG PO TABS: 600.0000 mg | ORAL_TABLET | Freq: Four times a day (QID) | ORAL | Status: DC | PRN

## 2014-03-22 MED ORDER — ONDANSETRON HCL 4 MG/2ML IJ SOLN
4.0000 mg | Freq: Four times a day (QID) | INTRAMUSCULAR | Status: DC | PRN
Start: 2014-03-22 — End: 2014-03-22
  Administered 2014-03-22: 4 mg via INTRAVENOUS
  Filled 2014-03-22: qty 2

## 2014-03-22 NOTE — Progress Notes (Signed)
obix fetal monitoring downtime.  Paper strip run

## 2014-03-22 NOTE — Progress Notes (Signed)
eda royal, spanish interpreter here for delivery

## 2014-03-22 NOTE — Anesthesia Procedure Notes (Signed)
Epidural Patient location during procedure: OB  Staffing Anesthesiologist: Darin Redmann R Performed by: anesthesiologist   Preanesthetic Checklist Completed: patient identified, pre-op evaluation, timeout performed, IV checked, risks and benefits discussed and monitors and equipment checked  Epidural Patient position: sitting Prep: site prepped and draped and DuraPrep Patient monitoring: heart rate Approach: midline Location: L2-L3 Injection technique: LOR air and LOR saline  Needle:  Needle type: Tuohy  Needle gauge: 17 G Needle length: 9 cm Needle insertion depth: 7 cm Catheter type: closed end flexible Catheter size: 19 Gauge Catheter at skin depth: 13 cm Test dose: negative  Assessment Sensory level: T8 Events: blood not aspirated, injection not painful, no injection resistance, negative IV test and no paresthesia  Additional Notes Reason for block:procedure for pain   

## 2014-03-22 NOTE — Anesthesia Preprocedure Evaluation (Signed)
Anesthesia Evaluation  Patient identified by MRN, date of birth, ID band Patient awake    Reviewed: Allergy & Precautions, H&P , NPO status , Patient's Chart, lab work & pertinent test results  Airway Mallampati: II TM Distance: >3 FB Neck ROM: Full    Dental   Pulmonary neg pulmonary ROS,  breath sounds clear to auscultation        Cardiovascular negative cardio ROS  Rhythm:Regular     Neuro/Psych negative neurological ROS  negative psych ROS   GI/Hepatic negative GI ROS, Neg liver ROS,   Endo/Other  negative endocrine ROS  Renal/GU negative Renal ROS     Musculoskeletal negative musculoskeletal ROS (+)   Abdominal (+) + obese,   Peds  Hematology negative hematology ROS (+)   Anesthesia Other Findings   Reproductive/Obstetrics (+) Pregnancy                           Anesthesia Physical Anesthesia Plan  ASA: II  Anesthesia Plan: Epidural   Post-op Pain Management:    Induction:   Airway Management Planned:   Additional Equipment:   Intra-op Plan:   Post-operative Plan:   Informed Consent: I have reviewed the patients History and Physical, chart, labs and discussed the procedure including the risks, benefits and alternatives for the proposed anesthesia with the patient or authorized representative who has indicated his/her understanding and acceptance.     Plan Discussed with:   Anesthesia Plan Comments:         Anesthesia Quick Evaluation

## 2014-03-22 NOTE — MAU Note (Signed)
Contractions every 6-8 minutes since 130 this morning. Red vaginal spotting since Wednesday. Denies LOF. Decreased fetal movement since Sunday during the day. Cervix dilated 1cm in office last Monday. Thinks is GBS positive. Denies complications with pregnancy.

## 2014-03-22 NOTE — H&P (Signed)
Katie [name] is a 38 y.o. female presenting for UC's. Maternal Medical History:  Reason for admission: Contractions.   Fetal activity: Perceived fetal activity is normal.   Last perceived fetal movement was within the past hour.    Prenatal complications: no prenatal complications Prenatal Complications - Diabetes: none.    OB History   Grav Para Term Preterm Abortions TAB SAB Ect Mult Living   6 3 3  0 2 0 2 0 0 3     Past Medical History  Diagnosis Date  . Gastritis   . Medical history non-contributory    Past Surgical History  Procedure Laterality Date  . No past surgeries     Family History: family history is not on file. Social History:  reports that she has never smoked. She has never used smokeless tobacco. She reports that she does not drink alcohol or use illicit drugs.   Prenatal Transfer Tool  Maternal Diabetes: No Genetic Screening: Normal Maternal Ultrasounds/Referrals: Normal Fetal Ultrasounds or other Referrals:  Referred to Materal Fetal Medicine  Maternal Substance Abuse:  No Significant Maternal Medications:  None Significant Maternal Lab Results:  None Other Comments:  None  Review of Systems  All other systems reviewed and are negative.   Dilation: 4 Effacement (%): 70 Station: -2 Exam by:: L.Stubbs, RN Blood pressure 116/67, pulse 82, temperature 97.5 F (36.4 C), temperature source Oral, resp. rate 22, height 5\' 1"  (1.549 m), weight 198 lb (89.812 kg). Maternal Exam:  Abdomen: Patient reports no abdominal tenderness. Fetal presentation: vertex  Introitus: Normal vulva. Normal vagina.  Pelvis: adequate for delivery.   Cervix: Cervix evaluated by digital exam.     Physical Exam  Nursing note and vitals reviewed. Constitutional: She is oriented to person, place, and time. She appears well-developed and well-nourished.  HENT:  Head: Normocephalic and atraumatic.  Eyes: Conjunctivae are normal. Pupils are equal, round, and reactive to  light.  Neck: Normal range of motion. Neck supple.  Cardiovascular: Normal rate and regular rhythm.   Respiratory: Effort normal.  GI: Soft.  Musculoskeletal: Normal range of motion.  Neurological: She is alert and oriented to person, place, and time.  Skin: Skin is warm and dry.  Psychiatric: She has a normal mood and affect. Her behavior is normal. Judgment and thought content normal.    Prenatal labs: ABO, Rh: A/Positive/-- (01/08 0000) Antibody: Negative (01/08 0000) Rubella: Immune (01/08 0000) RPR: Nonreactive (01/08 0000)  HBsAg: Negative (01/08 0000)  HIV: Non-reactive (01/08 0000)  GBS:     Assessment/Plan: 39.6 weeks.  Early labor.  Admit.   Brock Bad 03/22/2014, 5:15 AM

## 2014-03-23 LAB — CBC
HCT: 35.2 % — ABNORMAL LOW (ref 36.0–46.0)
Hemoglobin: 12.5 g/dL (ref 12.0–15.0)
MCH: 32.9 pg (ref 26.0–34.0)
MCHC: 35.5 g/dL (ref 30.0–36.0)
MCV: 92.6 fL (ref 78.0–100.0)
PLATELETS: 185 10*3/uL (ref 150–400)
RBC: 3.8 MIL/uL — ABNORMAL LOW (ref 3.87–5.11)
RDW: 14.1 % (ref 11.5–15.5)
WBC: 14.6 10*3/uL — AB (ref 4.0–10.5)

## 2014-03-23 NOTE — Anesthesia Postprocedure Evaluation (Signed)
Anesthesia Post Note  Patient: Katie [name]  Procedure(s) Performed: * No procedures listed *  Anesthesia type: Epidural  Patient location: Mother/Baby  Post pain: Pain level controlled  Post assessment: Post-op Vital signs reviewed  Last Vitals:  Filed Vitals:   03/23/14 0430  BP: 120/75  Pulse: 77  Temp: 36.4 C  Resp: 18    Post vital signs: Reviewed  Level of consciousness:alert  Complications: No apparent anesthesia complications

## 2014-03-23 NOTE — Lactation Note (Addendum)
This note was copied from the chart of Katie Charisma Ferris. Lactation Consultation Note  Patient Name: Katie [name] Date: 03/23/2014 Reason for consult: Follow-up assessment Baby 24 hours of life. Used international interpreter phone line [phone]. Mom reports that breastfeeding going very well. Mom reports some soreness, gave comfort gels with instructions, and enc to use expressed breast milk on nipples and allow to dry before applying comfort gels. Mom has experience with older children, 2-3 months. Mom attempted to latch baby, but baby ate at 1200 and is sleepy now. Mom able to hand express colostrum. Discussed engorgement prevention/treatment, number of diapers to look for, breastmilk storage times, referred mom to Baby and Me booklet. Mom aware of OP/BFSG and community resources.  Maternal Data    Feeding Feeding Type: Breast Fed Length of feed: 0 min  LATCH Score/Interventions Latch: Too sleepy or reluctant, no latch achieved, no sucking elicited.                    Lactation Tools Discussed/Used     Consult Status Consult Status: Complete    Sherlyn Hay 03/23/2014, 2:14 PM

## 2014-03-23 NOTE — Progress Notes (Signed)
Patient ID: Katie [name], female   DOB: 04-12-1976, 38 y.o.   MRN: [id number] Postpartum day one Vital signs normal fundus firm Lochia moderate Legs negative patient wants discharge

## 2014-03-23 NOTE — Discharge Instructions (Signed)
Discharge instructions   You can wash your hair  Shower  Eat what you want  Drink what you want  See me in 6 weeks  Your ankles are going to swell more in the next 2 weeks than when pregnant  No sex for 6 weeks   Kathreen Cosier, MD 03/23/2014

## 2014-03-23 NOTE — Discharge Summary (Signed)
Obstetric Discharge Summary Reason for Admission: onset of labor Prenatal Procedures: none Intrapartum Procedures: spontaneous vaginal delivery Postpartum Procedures: none Complications-Operative and Postpartum: none Hemoglobin  Date Value Ref Range Status  03/23/2014 12.5  12.0 - 15.0 g/dL Final     HCT  Date Value Ref Range Status  03/23/2014 35.2* 36.0 - 46.0 % Final    Physical Exam:  General: alert Lochia: appropriate Uterine Fundus: firm Incision: healing well DVT Evaluation: No evidence of DVT seen on physical exam.  Discharge Diagnoses: Term Pregnancy-delivered  Discharge Information: Date: 03/23/2014 Activity: pelvic rest Diet: routine Medications: Percocet Condition: stable Instructions: refer to practice specific booklet Discharge to: home Follow-up Information   Follow up with Katie Cosier, MD.   Specialty:  Obstetrics and Gynecology   Contact information:   [address] [place] [address] [phone]       Newborn Data: Live born female  Birth Weight: 7 lb 13.4 oz (3555 g) APGAR: 9, 9  Home with mother.  Katie [name] 03/23/2014, 6:37 AM

## 2014-03-24 ENCOUNTER — Ambulatory Visit: Payer: Self-pay

## 2014-03-24 NOTE — Lactation Note (Signed)
This note was copied from the chart of Katie [name]. Lactation Consultation Note  Patient Name: Katie [name] [id number] Date: 03/24/2014 Reason for consult: Follow-up assessment;Difficult latch [employer] Interpreter [phone] used for visit. Mom was started on nipple shield last night by RN due to baby having difficulty latching on the left breast. Mom reports baby is latching well on the right breast and the nipple shield has helped her to latch baby on left breast. Mom reports observing breast milk in the nipple shield with feedings. Mom's milk is coming in, no engorgement yet. Engorgement care reviewed. Reviewed use of nipple shield and encouraged Mom to pre-pump to help with latch. Advised to continue to try and BF without nipple shield as the latch may improve, use the nipple shield as needed. Encouraged to schedule OP f/u next week, Mom will call. Hand pump given, demonstrated how to use/clean. Flange changed to size 27. Mom denies other questions or concerns.   Maternal Data    Feeding Feeding Type: Breast Fed Length of feed: 10 min  LATCH Score/Interventions                      Lactation Tools Discussed/Used Tools: Nipple Shields;Pump;Flanges Nipple shield size: 24 Flange Size: 27 Breast pump type: Manual   Consult Status Consult Status: Complete    Alfred Levins 03/24/2014, 1:00 PM

## 2014-08-16 ENCOUNTER — Encounter (HOSPITAL_COMMUNITY): Payer: Self-pay

## 2016-10-30 ENCOUNTER — Encounter (HOSPITAL_COMMUNITY): Payer: Self-pay | Admitting: *Deleted

## 2016-11-16 ENCOUNTER — Encounter: Payer: Managed Care, Other (non HMO) | Admitting: Family Medicine

## 2016-12-04 ENCOUNTER — Encounter (HOSPITAL_COMMUNITY): Payer: Self-pay | Admitting: *Deleted

## 2016-12-04 ENCOUNTER — Ambulatory Visit (HOSPITAL_COMMUNITY)
Admission: RE | Admit: 2016-12-04 | Discharge: 2016-12-04 | Disposition: A | Discharge status: Home / Self Care | Payer: Self-pay | Source: Ambulatory Visit | Attending: Obstetrics and Gynecology | Admitting: Obstetrics and Gynecology

## 2016-12-04 VITALS — BP 112/70 | Temp 98.6°F | Ht 61.0 in | Wt 182.8 lb

## 2016-12-04 DIAGNOSIS — Z1239 Encounter for other screening for malignant neoplasm of breast: Secondary | ICD-10-CM

## 2016-12-04 DIAGNOSIS — R87610 Atypical squamous cells of undetermined significance on cytologic smear of cervix (ASC-US): Secondary | ICD-10-CM

## 2016-12-04 NOTE — Progress Notes (Signed)
[facility] and [facility] with positive HPV. Pap smear completed 10/12/2016  Pap Smear:  Pap smear not completed today. [facility] with positive HPV. [facility]. Appointment scheduled for Tuesday, December 11, 2016 at 1520. Per patient has no history of abnormal Pap smears prior to the most recent Pap smear. Last Pap smear result is in EPIC.  Physical exam: Breasts Breasts symmetrical. No skin abnormalities bilateral breasts. Bilateral nipple inversion that per patient is normal for her No nipple discharge bilateral breasts. No lymphadenopathy. No lumps palpated bilateral breasts. No complaints of pain or tenderness on exam. Screening mammogram recommended at age 39 unless clinically indicated prior.        Pelvic/Bimanual No Pap smear completed today since last Pap smear was 10/12/2016. Pap smear not indicated per BCCCP guidelines.   Smoking History: Patient has never smoked.  Patient Navigation: Patient education provided. [facility]. Spanish interpreter provided.  Used Spanish interpreter Viviana SimplerAlis Herrera from [facility].

## 2016-12-04 NOTE — Patient Instructions (Signed)
Explained breast self awareness with Katie [name]. Patient did not need a Pap smear today due to last Pap smear was 10/12/2016. Explained the colposcopy the recommended follow up for her abnormal Pap smear. [facility] for a [facility]ps[facility]. Appointment scheduled for Tuesday, December 11, 2016 at 1520. Patient aware of appointment and will be there. Let patient she will need a screening mammogram at age 41 unless clinically indicated prior. Katie [name] verbalized understanding.  Katie [name], [name], RN 2:34 PM

## 2016-12-07 ENCOUNTER — Encounter (HOSPITAL_COMMUNITY): Payer: Self-pay | Admitting: *Deleted

## 2016-12-11 ENCOUNTER — Encounter: Payer: Self-pay | Admitting: Obstetrics and Gynecology

## 2016-12-11 ENCOUNTER — Other Ambulatory Visit (HOSPITAL_COMMUNITY)
Admission: RE | Admit: 2016-12-11 | Discharge: 2016-12-11 | Disposition: A | Discharge status: Home / Self Care | Payer: No Typology Code available for payment source | Source: Ambulatory Visit | Attending: Family Medicine | Admitting: Family Medicine

## 2016-12-11 ENCOUNTER — Ambulatory Visit (INDEPENDENT_AMBULATORY_CARE_PROVIDER_SITE_OTHER): Payer: Self-pay | Admitting: Obstetrics and Gynecology

## 2016-12-11 VITALS — BP 128/80 | HR 83 | Wt 179.0 lb

## 2016-12-11 DIAGNOSIS — Z3202 Encounter for pregnancy test, result negative: Secondary | ICD-10-CM

## 2016-12-11 DIAGNOSIS — N879 Dysplasia of cervix uteri, unspecified: Secondary | ICD-10-CM

## 2016-12-11 LAB — POCT PREGNANCY, URINE: Preg Test, Ur: NEGATIVE

## 2016-12-11 NOTE — Procedures (Signed)
Colposcopy Procedure Note  Pre-operative Diagnosis: ASCUS/HPV positive pap smear  Post-operative Diagnosis: CIN 2  History: 09/2016 [facility] abnormal pap smear. Patient unsure of last pap but denies any h/o abnormal ones and has never had a colposcopy  Procedure Details  LMP 2/16; UPT negative. patient also denies any tobacco use or 2nd hand smoke exposure.   The risks (including infection, bleeding, pain) and benefits of the procedure were explained to the patient and written informed consent was obtained.  The patient was placed in the dorsal lithotomy position. A Graves was speculum inserted in the vagina, and the cervix was visualized.  AA staining done Lugol's with green filter.  Biopsy done at 6 and then 12 o'clock and then single toothed tenaculum applied and ECC in all four quadrants done. No bleeding after procedure after Monsel's applied to cervix  Findings: Large, multiparous cervix and cervical os. on AA staining there was diffuse AWE changes seen circumferentially extending towards the bend of the cervix. At 11-12 o'clock, there was increased erythema and and mosaicism and punctuate lesions. Confirmatory under green filter and Lugol's staining.   Adequate: Yes  Specimens: 6 and 12 o'clock and ECC  Condition: Stable  Complications: None  Plan: The patient was advised to call for any fever or for prolonged or severe pain or bleeding. She was advised to use OTC analgesics as needed for mild to moderate pain. She was advised to avoid vaginal intercourse for 48 hours or until the bleeding has completely stopped.   Cornelia Copaharlie Iley Deignan, Jr MD Attending [facility](Faculty Practice)

## 2016-12-24 ENCOUNTER — Telehealth: Payer: Self-pay | Admitting: Obstetrics and Gynecology

## 2016-12-24 DIAGNOSIS — N870 Mild cervical dysplasia: Secondary | ICD-10-CM | POA: Insufficient documentation

## 2016-12-24 NOTE — Telephone Encounter (Signed)
GYN Telephone Note Patient called at [phone] and generic VM picked up. VM left for patient to call clinic. If patient calls, please tell her that her biopsies just showed slightly abnormal cells and to repeat her pap smear in one year. If no response, will send letter  Cornelia Copaharlie Patrecia Veiga, Jr MD Attending [facility] ([facility] 12/24/2016 Time: 1136am

## 2016-12-27 ENCOUNTER — Telehealth: Payer: Self-pay | Admitting: *Deleted

## 2016-12-27 NOTE — Telephone Encounter (Signed)
-----   Message from [place] Bingharlie Pickens, MD sent at 12/27/2016  2:42 PM EDT ----- Can y'all send her a letter stating that she just needs to have every year paps for the next two years? Thanks!

## 2016-12-27 NOTE — Telephone Encounter (Signed)
Called patient with colpo results and recommendations for follow up. Used Spanish interpreter [phone]. Patient voiced understanding. She asked about bleeding which had been going on since the colpo. Bleeding was "like a period" but stated only changes her pad twice a day. After consulting with Dr Vergie LivingPickens, recommended that patient be seen to investigate whether this bleeding was caused by the colpo or if this is irregular uterine bleeding. Patient voiced understanding and said she would schedule an appointment.

## 2018-02-11 ENCOUNTER — Ambulatory Visit (HOSPITAL_COMMUNITY)
Admission: RE | Admit: 2018-02-11 | Discharge: 2018-02-11 | Disposition: A | Discharge status: Home / Self Care | Payer: No Typology Code available for payment source | Source: Ambulatory Visit | Attending: Obstetrics and Gynecology | Admitting: Obstetrics and Gynecology

## 2018-02-11 ENCOUNTER — Encounter (HOSPITAL_COMMUNITY): Payer: Self-pay

## 2018-02-11 ENCOUNTER — Other Ambulatory Visit (HOSPITAL_COMMUNITY): Payer: Self-pay | Admitting: *Deleted

## 2018-02-11 VITALS — BP 124/74 | Ht 62.0 in | Wt 174.0 lb

## 2018-02-11 DIAGNOSIS — Z01419 Encounter for gynecological examination (general) (routine) without abnormal findings: Secondary | ICD-10-CM

## 2018-02-11 DIAGNOSIS — N644 Mastodynia: Secondary | ICD-10-CM

## 2018-02-11 NOTE — Patient Instructions (Signed)
Explained breast self awareness with Tawni Carnes. Let patient know that if today's Pap smear is normal that her next Pap smear is due in one year due to her history of an abnormal Pap smear. Referred patient to the [facility] of [facility] for a diagnostic mammogram and possible right breast ultrasound. Appointment scheduled for Tuesday, Feb 18, 2018 at 0910. Patient aware of appointment and will be there. Let patient know will follow up with her within the next week with results of Pap smear and wet prep by phone. [name] verbalized understanding.  [place]annock, [name], RN 12:08 PM

## 2018-02-11 NOTE — Progress Notes (Signed)
Complaints of right breast pain x 3 weeks and a spontaneous clear to whitish colored bilateral breast discharge x 2 weeks. Patient rates pain at a 4 out of 10.  Pap Smear: Pap smear completed today. Last Pap smear was 10/12/2016 at the [facility] and ASCUS with positive HPV. 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 Per patient her most recent Pap smear is the only abnormal Pap smear she has had. Last Pap smear and colposcopy results are in EPIC.  Physical exam: Breasts Breasts symmetrical. No skin abnormalities bilateral breasts. Bilateral nipple inversion that per patient is normal for her. No nipple discharge bilateral breasts. Unable to express any nipple discharge from bilateral breasts on exam. No lymphadenopathy. No lumps palpated bilateral breasts. Complaints of right outer breast tenderness on exam. Referred patient to the [facility] of [facility] for a diagnostic mammogram and possible right breast ultrasound. Appointment scheduled for Tuesday, Feb 18, 2018 at 0910.      Pelvic/Bimanual   Ext Genitalia No lesions, no swelling and no discharge observed on external genitalia.         Vagina Vagina pink and normal texture. No lesions and yellowish colored discharge observed in vagina. Wet prep completed.        Cervix Cervix is present. Cervix pink and of normal texture. Yellowish colored discharge observed on cervix.    Uterus Uterus is present and palpable. Uterus in normal position and normal size.        Adnexae Bilateral ovaries present and palpable. No tenderness on palpation.         Rectovaginal No rectal exam completed today since patient had no rectal complaints. No skin abnormalities observed on exam.    Smoking History: Patient has never smoked.  Patient Navigation: Patient education provided. Access to services provided for patient through [facility] program.   Breast and Cervical Cancer Risk Assessment: Patient  has no family history of breast cancer, known genetic mutations, or radiation treatment to the chest before age 56. Patient has no history of cervical dysplasia, immunocompromised, or DES exposure in-utero.

## 2018-02-12 ENCOUNTER — Encounter (HOSPITAL_COMMUNITY): Payer: Self-pay | Admitting: *Deleted

## 2018-02-13 LAB — CYTOLOGY - PAP
BACTERIAL VAGINITIS: POSITIVE — AB
Candida vaginitis: NEGATIVE
Diagnosis: NEGATIVE
HPV: DETECTED — AB
Trichomonas: NEGATIVE

## 2018-02-18 ENCOUNTER — Ambulatory Visit
Admission: RE | Admit: 2018-02-18 | Discharge: 2018-02-18 | Disposition: A | Discharge status: Home / Self Care | Payer: No Typology Code available for payment source | Source: Ambulatory Visit | Attending: Obstetrics and Gynecology | Admitting: Obstetrics and Gynecology

## 2018-02-18 DIAGNOSIS — N644 Mastodynia: Secondary | ICD-10-CM

## 2018-02-26 ENCOUNTER — Other Ambulatory Visit (HOSPITAL_COMMUNITY): Payer: Self-pay | Admitting: *Deleted

## 2018-02-26 ENCOUNTER — Telehealth (HOSPITAL_COMMUNITY): Payer: Self-pay | Admitting: *Deleted

## 2018-02-26 MED ORDER — METRONIDAZOLE 500 MG PO TABS: 500.0000 mg | ORAL_TABLET | Freq: Two times a day (BID) | ORAL | 0 refills | Status: DC

## 2018-02-26 NOTE — Telephone Encounter (Signed)
Telephoned patient at home number and advised patient of negative pap smear results. HPV was positive. Next pap smear due in one year. Wet prep showed bacterial vaginosis. Medication called into [facility]. Advised patient to finish all medication and no alcohol while taking medication. Patient voiced understanding.

## 2018-04-09 ENCOUNTER — Encounter: Payer: Self-pay | Admitting: Obstetrics & Gynecology

## 2018-04-09 ENCOUNTER — Ambulatory Visit (INDEPENDENT_AMBULATORY_CARE_PROVIDER_SITE_OTHER): Payer: Self-pay | Admitting: Obstetrics & Gynecology

## 2018-04-09 VITALS — BP 123/55 | HR 81 | Resp 16 | Wt 171.3 lb

## 2018-04-09 DIAGNOSIS — N643 Galactorrhea not associated with childbirth: Secondary | ICD-10-CM | POA: Insufficient documentation

## 2018-04-09 NOTE — Progress Notes (Signed)
   Subjective:    Patient ID: [name], female    DOB: [birth year], 42 y.o.   MRN: [id number]  HPI  42 yo single P4 Latina [facility] galactorrhea for the last 6 months. Nothing makes it better or worse. [facility] She uses OCPs for contraception.  Review of Systems She had a normal pap recently at [facility]    Objective:   Physical Exam Breathing, conversing, and ambulating normally Well nourished, well hydrated Latina, no apparent distress      Assessment & Plan:  Galactorrhea- schedule morning prolactin level.

## 2018-04-21 ENCOUNTER — Other Ambulatory Visit: Payer: Self-pay

## 2018-04-21 ENCOUNTER — Encounter: Payer: Self-pay | Admitting: General Practice

## 2018-04-21 DIAGNOSIS — N643 Galactorrhea not associated with childbirth: Secondary | ICD-10-CM

## 2018-04-22 LAB — PROLACTIN: Prolactin: 28.3 ng/mL — ABNORMAL HIGH (ref 4.8–23.3)

## 2018-05-19 ENCOUNTER — Telehealth: Payer: Self-pay | Admitting: *Deleted

## 2018-05-19 NOTE — Telephone Encounter (Signed)
Called patient with Interpreter [name] and notified her per Dr. Debroah LoopArnold prolactin level normal. No questions. Voices understanding.

## 2018-05-19 NOTE — Telephone Encounter (Signed)
-----   Message from Adam PhenixJames G Arnold, MD sent at 05/16/2018  7:29 PM EDT ----- Normal prolactin level

## 2019-10-21 ENCOUNTER — Other Ambulatory Visit: Payer: Self-pay

## 2019-10-21 DIAGNOSIS — Z20822 Contact with and (suspected) exposure to covid-19: Secondary | ICD-10-CM

## 2019-10-22 LAB — NOVEL CORONAVIRUS, NAA: SARS-CoV-2, NAA: NOT DETECTED

## 2019-10-26 ENCOUNTER — Ambulatory Visit: Payer: Self-pay | Attending: Internal Medicine

## 2019-10-26 DIAGNOSIS — Z20822 Contact with and (suspected) exposure to covid-19: Secondary | ICD-10-CM

## 2019-10-26 DIAGNOSIS — U071 COVID-19: Secondary | ICD-10-CM | POA: Insufficient documentation

## 2019-10-27 LAB — NOVEL CORONAVIRUS, NAA: SARS-CoV-2, NAA: DETECTED — AB

## 2020-10-22 ENCOUNTER — Other Ambulatory Visit: Payer: Self-pay

## 2020-10-22 DIAGNOSIS — Z20822 Contact with and (suspected) exposure to covid-19: Secondary | ICD-10-CM

## 2020-10-24 LAB — SARS-COV-2, NAA 2 DAY TAT

## 2020-10-24 LAB — NOVEL CORONAVIRUS, NAA: SARS-CoV-2, NAA: DETECTED — AB

## 2021-04-19 ENCOUNTER — Other Ambulatory Visit: Payer: Self-pay

## 2021-04-19 DIAGNOSIS — N6452 Nipple discharge: Secondary | ICD-10-CM

## 2021-05-18 ENCOUNTER — Other Ambulatory Visit: Payer: Self-pay

## 2021-05-18 ENCOUNTER — Ambulatory Visit: Payer: Self-pay | Admitting: *Deleted

## 2021-05-18 ENCOUNTER — Ambulatory Visit: Payer: Self-pay

## 2021-05-18 ENCOUNTER — Ambulatory Visit
Admission: RE | Admit: 2021-05-18 | Discharge: 2021-05-18 | Disposition: A | Discharge status: Home / Self Care | Payer: Self-pay | Source: Ambulatory Visit | Attending: Obstetrics and Gynecology | Admitting: Obstetrics and Gynecology

## 2021-05-18 ENCOUNTER — Other Ambulatory Visit: Payer: Self-pay | Admitting: Obstetrics and Gynecology

## 2021-05-18 VITALS — BP 126/82 | Wt 184.8 lb

## 2021-05-18 DIAGNOSIS — Z1231 Encounter for screening mammogram for malignant neoplasm of breast: Secondary | ICD-10-CM

## 2021-05-18 DIAGNOSIS — Z1239 Encounter for other screening for malignant neoplasm of breast: Secondary | ICD-10-CM

## 2021-05-18 NOTE — Progress Notes (Signed)
[name] is a 45 y.o. female who [name] today with complaint of spontaneous bilateral milky breast discharge since 2019 that was noted on last exam 02/11/2018. Patients last mammogram was completed 02/18/2018 and was negative. Patient had a prolactin level completed 04/21/2018 that was elevated.    Pap Smear: Pap smear not completed today. Last Pap smear was 03/17/2021 at [name] and was normal with negative HPV per Katie Salines, RN at the [facility]. 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 Last Pap smear result is not available in Epic.   Physical exam: Breasts Breasts symmetrical. No skin abnormalities bilateral breasts. No nipple retraction bilateral breasts. No nipple discharge bilateral breasts. Unable to express any nipple discharge on exam today. No lymphadenopathy. No lumps palpated bilateral breasts. No complaints of pain or tenderness on exam. Due to no change in breast symptoms and previous mammogram negative per Dr. Mayford Knife at the [facility] screening mammogram recommended today.     MM DIAG BREAST TOMO BILATERAL  Result Date: 02/18/2018 CLINICAL DATA:  Patient presents for bilateral diagnostic examination due to 2 month history of spontaneous milky/yellowish bilateral nipple discharge. Patient also complains of focal pain over the upper outer right breast. No focal palpable abnormality. EXAM: DIGITAL DIAGNOSTIC bilateral MAMMOGRAM WITH CAD AND TOMO ULTRASOUND right BREAST COMPARISON:  None. ACR Breast Density Category b: There are scattered areas of fibroglandular density. FINDINGS: Examination demonstrates no focal abnormality over the retroareolar region bilaterally to account for patient's discharge. No focal abnormality over the upper outer right breast  to account for patient's focal pain. There is a group of benign dermal calcifications over the inner mid to upper left breast. Mammographic images were processed with CAD. On physical exam, I am able to elicit thick yellowish-white discharge bilaterally left worse than right over the central nipple. Targeted ultrasound is performed, showing no focal abnormality over the upper outer right breast to account for patient's focal pain. IMPRESSION: Bilateral benign hormonal nipple discharge. No focal abnormality over the upper outer right breast to account for patient's focal pain. RECOMMENDATION: Recommend continued management of patient's bilateral nipple discharge and focal right breast pain on a clinical basis. Consider assessing serum prolactin level. I have discussed the findings and recommendations with the patient. Results were also provided in writing at the conclusion of the visit. If applicable, a reminder letter will be sent to the patient regarding the next appointment. BI-RADS CATEGORY  1: Negative. Electronically Signed   By: Elberta Fortis M.D.   On: 02/18/2018 10:37     Pelvic/Bimanual Pap is not indicated today per [facility] guidelines.   Smoking History: Patient has never smoked.   Patient Navigation: Patient education provided. Access to [name] provided for patient through [place] program. Spanish interpreter Alene Mires from [name] provided.    Breast and Cervical Cancer Risk Assessment: Patient does not have family history of breast cancer, known genetic mutations, or radiation treatment to the chest before age 8. Patient has history of cervical dysplasia. Patient has no history of being immunocompromised or DES exposure in-utero.  Risk Assessment     Risk Scores       05/18/2021   Last edited by: Narda Rutherford, LPN   5-year risk: 0.4 %   Lifetime risk: 5.7 %  A: [facility] exam without pap smear Complaint of bilateral spontaneous milky breast discharge.  P: Referred  patient to the [facility] of [facility] for a screening mammogram on mobile unit. Appointment scheduled Thursday, May 18, 2021 at 1140.  Priscille [place], RN 05/18/2021 10:51 AM

## 2021-05-18 NOTE — Patient Instructions (Signed)
Explained breast self awareness with [name]. Patient did not need a Pap smear today due to last Pap smear was 03/17/2021. Let her know that her next Pap smear will be due in one year due to her history of an abnormal Pap smear. Referred patient to the [facility] of [facility] for a screening mammogram on mobile unit. Appointment scheduled Thursday, May 18, 2021 at 1140. Patient escorted to the mobile unit following [facility] appointment for her screening mammogram. Let patient know the Breast Center will follow up with her within the next couple weeks with results of her mammogram by letter or phone. [name] verbalized understanding.  [name], [name], RN 10:51 AM

## 2022-02-13 IMAGING — MG MM DIGITAL SCREENING BILAT W/ TOMO AND CAD
6 of 10 series · 6 of 30 positions shown · non-contrast
Comparison: Previous exam(s).

CLINICAL DATA: Screening.

EXAM:
DIGITAL SCREENING BILATERAL MAMMOGRAM WITH TOMOSYNTHESIS AND CAD
TECHNIQUE: Bilateral screening digital craniocaudal and mediolateral oblique
mammograms were obtained. Bilateral screening digital breast
tomosynthesis was performed. The images were evaluated with
computer-aided detection.

[L CC synth-2D]
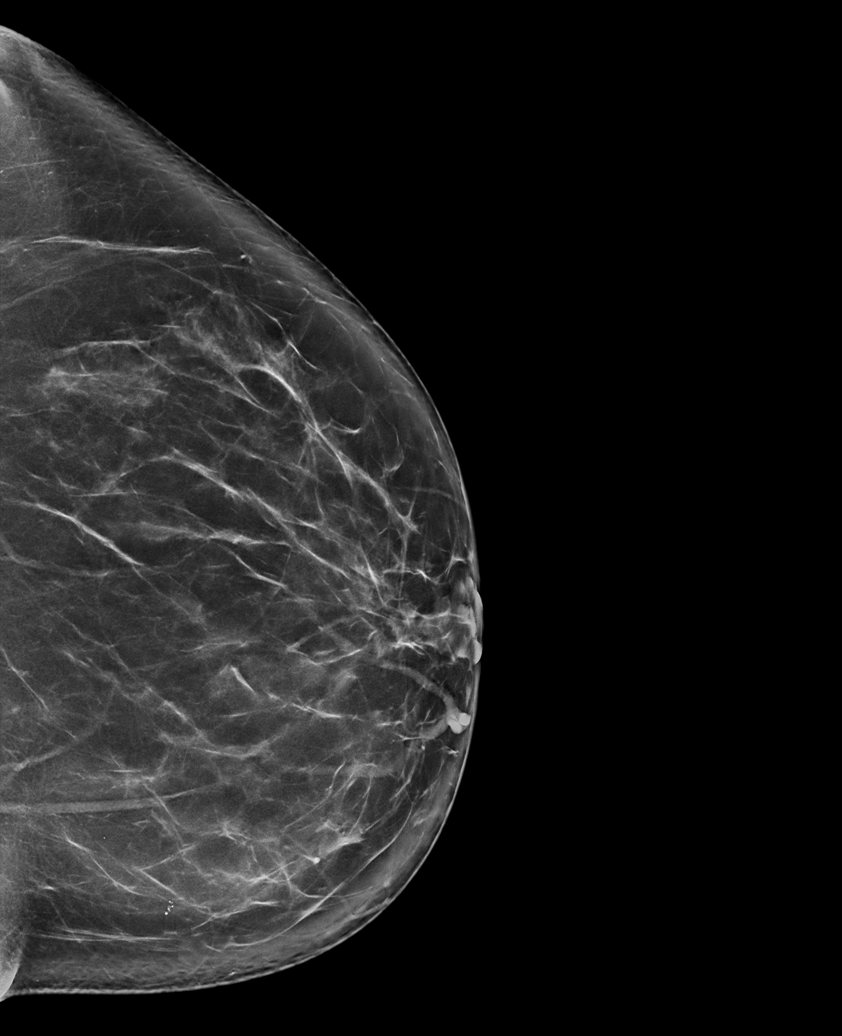

[R MLO synth-2D (1 of 2)]
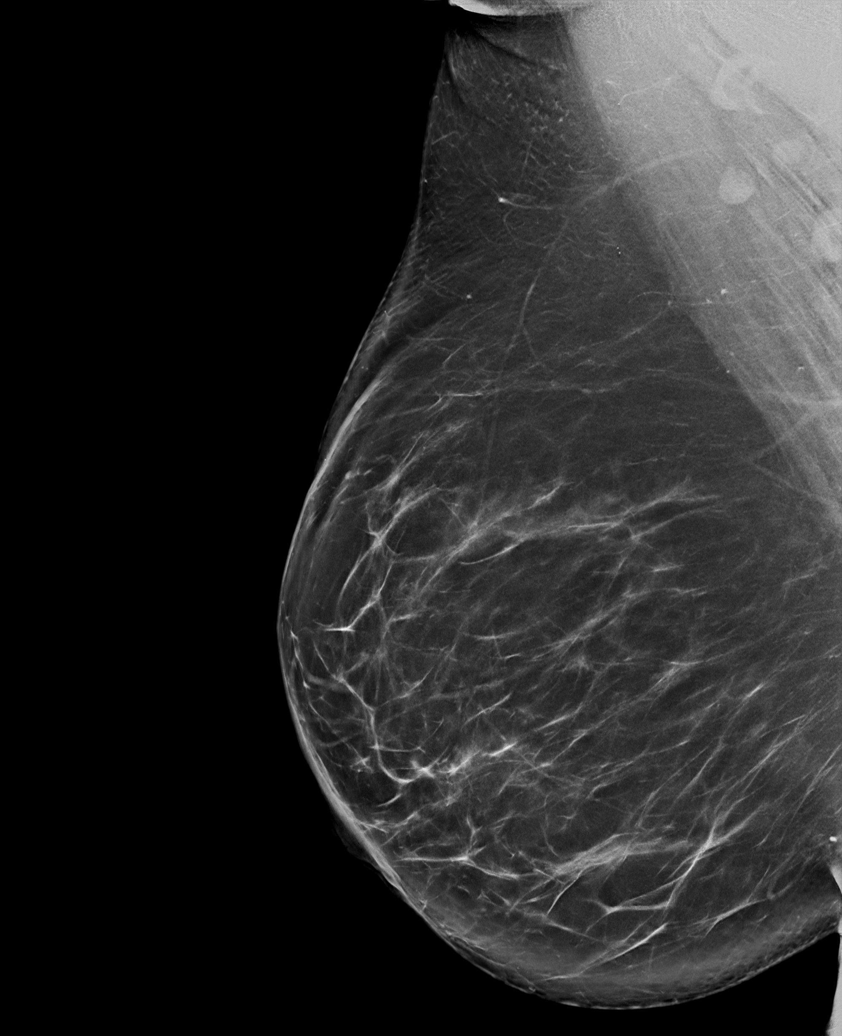

[R CC synth-2D]
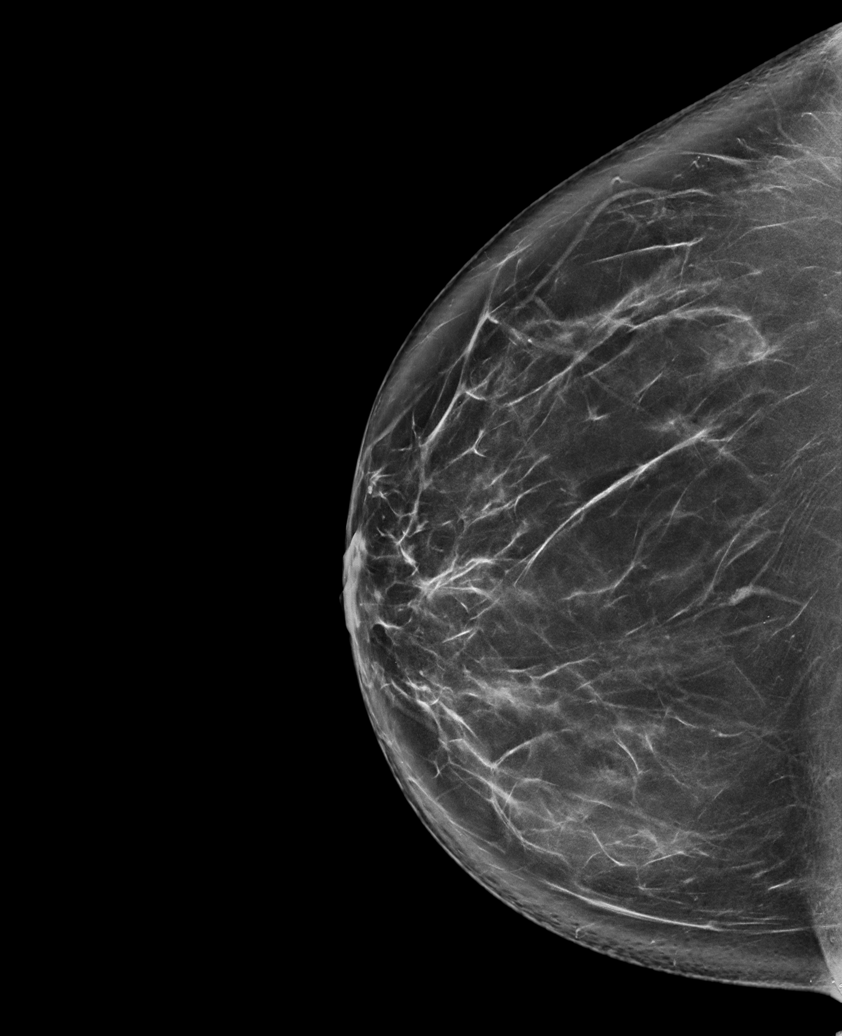

[R MLO synth-2D (2 of 2)]
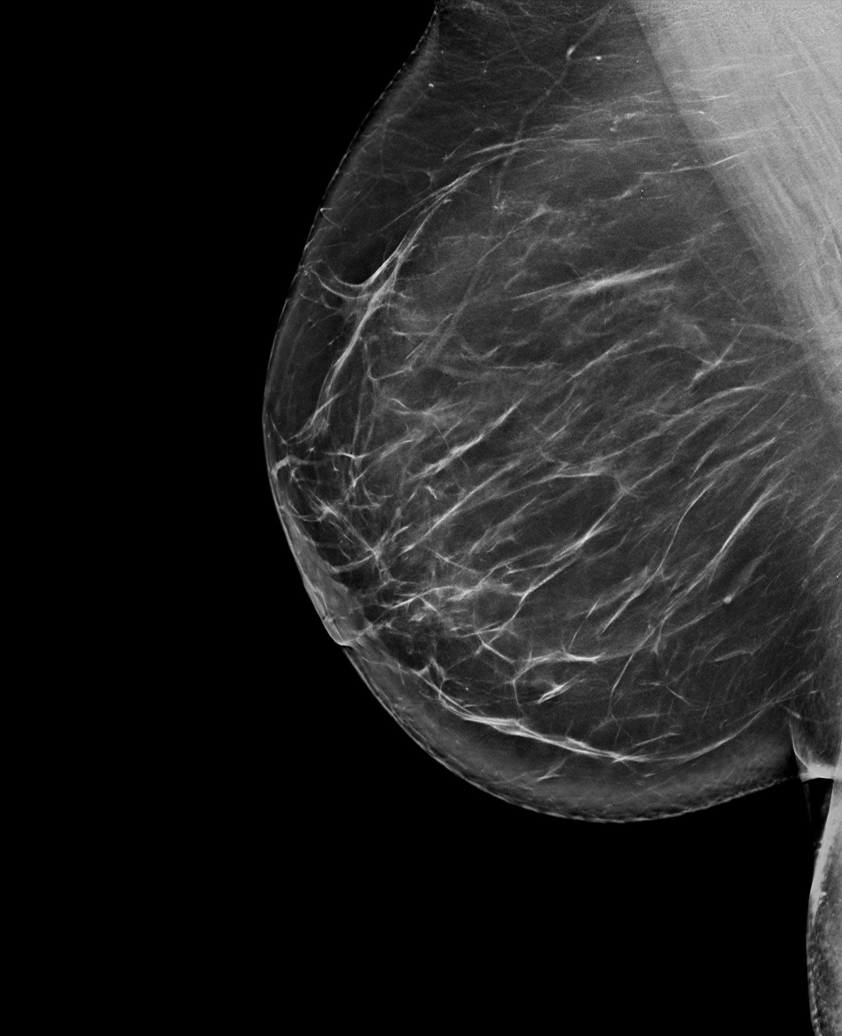

[L MLO synth-2D]
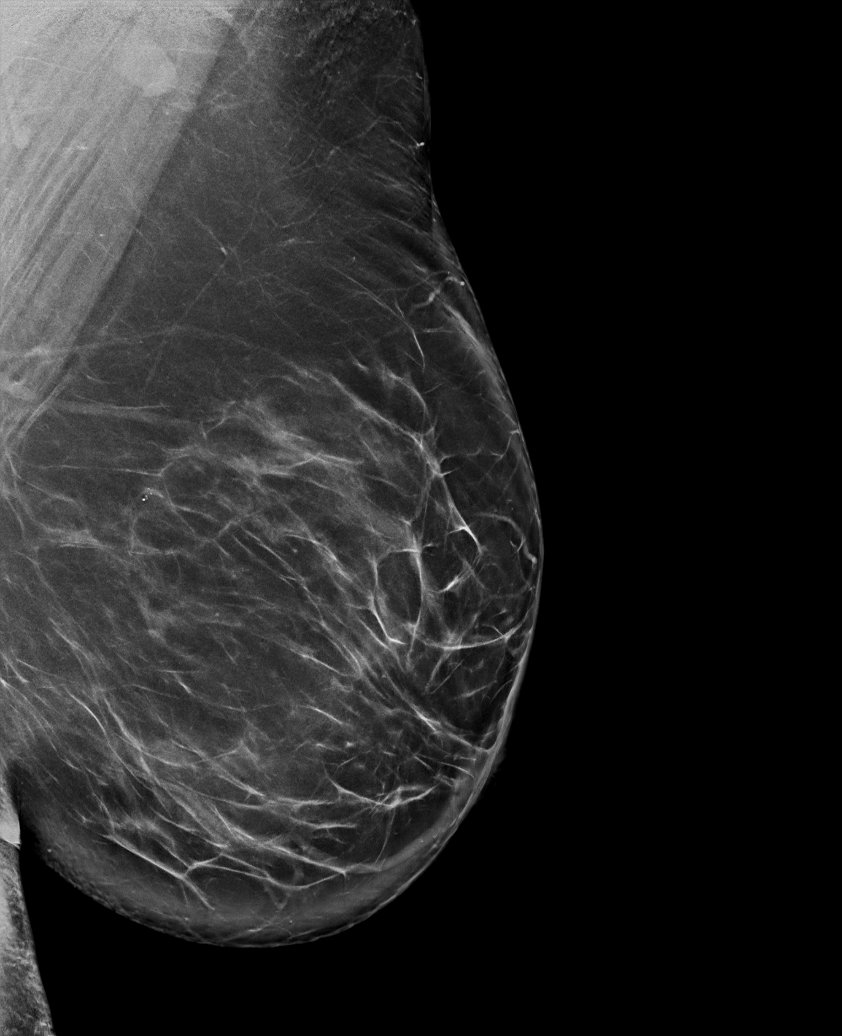

[L MLO tomo · tomo slice 51/102.0]
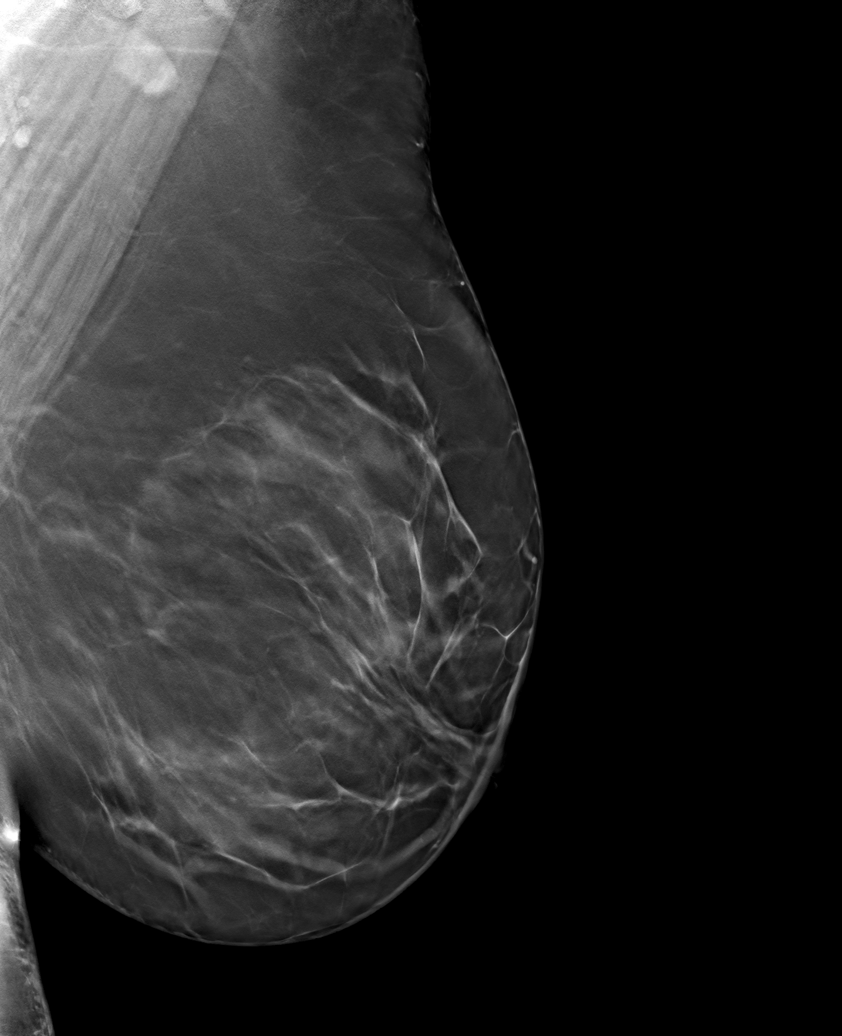

[6 of 30 positions shown; findings below may reference images not displayed]

ACR Breast Density Category b: There are scattered areas of
fibroglandular density.
FINDINGS: There are no findings suspicious for malignancy.
IMPRESSION: No mammographic evidence of malignancy. A result letter of this
screening mammogram will be mailed directly to the patient.

RECOMMENDATION:
Screening mammogram in one year. (Code:51-O-LD2)

BI-RADS CATEGORY  1: Negative.

## 2022-08-23 ENCOUNTER — Other Ambulatory Visit: Payer: Self-pay

## 2022-08-23 DIAGNOSIS — Z1231 Encounter for screening mammogram for malignant neoplasm of breast: Secondary | ICD-10-CM

## 2022-10-16 ENCOUNTER — Ambulatory Visit: Payer: No Typology Code available for payment source

## 2022-11-08 ENCOUNTER — Ambulatory Visit
Admission: RE | Admit: 2022-11-08 | Discharge: 2022-11-08 | Disposition: A | Discharge status: Home / Self Care | Payer: No Typology Code available for payment source | Source: Ambulatory Visit | Attending: Obstetrics and Gynecology | Admitting: Obstetrics and Gynecology

## 2022-11-08 ENCOUNTER — Ambulatory Visit: Payer: Self-pay | Admitting: Hematology and Oncology

## 2022-11-08 VITALS — BP 122/58 | Wt 196.0 lb

## 2022-11-08 DIAGNOSIS — Z1231 Encounter for screening mammogram for malignant neoplasm of breast: Secondary | ICD-10-CM

## 2022-11-08 NOTE — Patient Instructions (Signed)
Taught [name] about self breast awareness and gave educational materials to take home. Patient not need a Pap smear today due to last Pap smear was in 2022 per patient. Let her know [facility] will cover Pap smears every 5 years unless has a history of abnormal Pap smears. Referred patient to the [place] for diagnostic mammogram. Appointment scheduled for 11/08/22. Patient aware of appointment and will be there. Let patient know will follow up with her within the next couple weeks with results. [name] verbalized understanding.  Melodye Ped, NP 2:23 PM

## 2022-11-08 NOTE — Progress Notes (Signed)
Katie [name] is a 47 y.o. female who presents to [facility] clinic today with no complaints.    Pap Smear: Pap not smear completed today. Last Pap smear was 2022 and was normal. Per patient has history of an abnormal Pap smear. Last Pap smear result is available in Epic. 02/11/2018 Negative/ HPV+, 10/12/16 ASCUS/ HPV+, 11/07/2004 Negative with no HPV. Will repeat Pap smear this year, she is not prepared to have one today.   Physical exam: Breasts Breasts symmetrical. No skin abnormalities bilateral breasts. No nipple retraction bilateral breasts. No nipple discharge bilateral breasts. No lymphadenopathy. No lumps palpated bilateral breasts.     MS DIGITAL SCREENING TOMO BILATERAL  Result Date: 05/26/2021 CLINICAL DATA:  Screening. EXAM: DIGITAL SCREENING BILATERAL MAMMOGRAM WITH TOMOSYNTHESIS AND CAD TECHNIQUE: Bilateral screening digital craniocaudal and mediolateral oblique mammograms were obtained. Bilateral screening digital breast tomosynthesis was performed. The images were evaluated with computer-aided detection. COMPARISON:  Previous exam(s). ACR Breast Density Category b: There are scattered areas of fibroglandular density. FINDINGS: There are no findings suspicious for malignancy. IMPRESSION: No mammographic evidence of malignancy. A result letter of this screening mammogram will be mailed directly to the patient. RECOMMENDATION: Screening mammogram in one year. (Code:SM-B-01Y) BI-RADS CATEGORY  1: Negative. Electronically Signed   By: Nolon Nations M.D.   On: 05/26/2021 10:06   MM DIAG BREAST TOMO BILATERAL  Result Date: 02/18/2018 CLINICAL DATA:  Patient presents for bilateral diagnostic examination due to 2 month history of spontaneous milky/yellowish bilateral nipple discharge. Patient also complains of focal pain over the upper outer right breast. No focal palpable abnormality. EXAM: DIGITAL DIAGNOSTIC bilateral MAMMOGRAM WITH CAD AND TOMO ULTRASOUND right BREAST COMPARISON:  None. ACR  Breast Density Category b: There are scattered areas of fibroglandular density. FINDINGS: Examination demonstrates no focal abnormality over the retroareolar region bilaterally to account for patient's discharge. No focal abnormality over the upper outer right breast to account for patient's focal pain. There is a group of benign dermal calcifications over the inner mid to upper left breast. Mammographic images were processed with CAD. On physical exam, I am able to elicit thick yellowish-white discharge bilaterally left worse than right over the central nipple. Targeted ultrasound is performed, showing no focal abnormality over the upper outer right breast to account for patient's focal pain. IMPRESSION: Bilateral benign hormonal nipple discharge. No focal abnormality over the upper outer right breast to account for patient's focal pain. RECOMMENDATION: Recommend continued management of patient's bilateral nipple discharge and focal right breast pain on a clinical basis. Consider assessing serum prolactin level. I have discussed the findings and recommendations with the patient. Results were also provided in writing at the conclusion of the visit. If applicable, a reminder letter will be sent to the patient regarding the next appointment. BI-RADS CATEGORY  1: Negative. Electronically Signed   By: Marin Olp M.D.   On: 02/18/2018 10:37      Pelvic/Bimanual Pap is not indicated today    Smoking History: Patient has never smoked and was not referred to quit line.    Patient Navigation: Patient education provided. Access to services provided for patient through [place] interpreter provided. No transportation provided   Colorectal Cancer Screening: Per patient has never had colonoscopy completed No complaints today.    Breast and Cervical Cancer Risk Assessment: Patient does not have family history of breast cancer, known genetic mutations, or radiation treatment to the chest  before age 79. Patient has history of cervical dysplasia, immunocompromised, or  DES exposure in-utero.  Risk Scores as of 11/08/2022     [place]           5-year 0.62 %   Lifetime 7.97 %   This patient is [place]/Lat[place] but has no documented birth country, so the [place] used data from [place] patients to calculate their risk score. Document a birth country in the Demographics activity for a more accurate score.         Last calculated by Claretha Cooper, CMA on 11/08/2022 at  2:02 PM        A: [facility] exam without pap smear No complaints with benign exam.   P: Referred patient to the [facility] for a screening mammogram. Appointment scheduled 11/08/22.  Dayton Scrape A, NP 11/08/2022 2:18 PM

## 2024-09-07 ENCOUNTER — Other Ambulatory Visit: Payer: Self-pay

## 2024-09-07 ENCOUNTER — Encounter (HOSPITAL_COMMUNITY): Payer: Self-pay | Admitting: *Deleted

## 2024-09-07 ENCOUNTER — Emergency Department (HOSPITAL_COMMUNITY)
Admission: EM | Admit: 2024-09-07 | Discharge: 2024-09-07 | Disposition: A | Discharge status: Home / Self Care | Attending: Emergency Medicine | Admitting: Emergency Medicine

## 2024-09-07 DIAGNOSIS — K2900 Acute gastritis without bleeding: Secondary | ICD-10-CM | POA: Insufficient documentation

## 2024-09-07 DIAGNOSIS — R7401 Elevation of levels of liver transaminase levels: Secondary | ICD-10-CM | POA: Insufficient documentation

## 2024-09-07 LAB — HCG, SERUM, QUALITATIVE: Preg, Serum: NEGATIVE

## 2024-09-07 LAB — URINALYSIS, ROUTINE W REFLEX MICROSCOPIC
Bilirubin Urine: NEGATIVE
Glucose, UA: NEGATIVE mg/dL
Hgb urine dipstick: NEGATIVE
Ketones, ur: NEGATIVE mg/dL
Nitrite: NEGATIVE
Protein, ur: NEGATIVE mg/dL
Specific Gravity, Urine: 1.021 (ref 1.005–1.030)
pH: 7 (ref 5.0–8.0)

## 2024-09-07 LAB — CBC
HCT: 38 % (ref 36.0–46.0)
Hemoglobin: 12.7 g/dL (ref 12.0–15.0)
MCH: 31 pg (ref 26.0–34.0)
MCHC: 33.4 g/dL (ref 30.0–36.0)
MCV: 92.7 fL (ref 80.0–100.0)
Platelets: 300 K/uL (ref 150–400)
RBC: 4.1 MIL/uL (ref 3.87–5.11)
RDW: 13.2 % (ref 11.5–15.5)
WBC: 7.7 K/uL (ref 4.0–10.5)
nRBC: 0 % (ref 0.0–0.2)

## 2024-09-07 LAB — COMPREHENSIVE METABOLIC PANEL WITH GFR
ALT: 31 U/L (ref 0–44)
AST: 42 U/L — ABNORMAL HIGH (ref 15–41)
Albumin: 3.3 g/dL — ABNORMAL LOW (ref 3.5–5.0)
Alkaline Phosphatase: 67 U/L (ref 38–126)
Anion gap: 13 (ref 5–15)
BUN: 12 mg/dL (ref 6–20)
CO2: 22 mmol/L (ref 22–32)
Calcium: 8.5 mg/dL — ABNORMAL LOW (ref 8.9–10.3)
Chloride: 104 mmol/L (ref 98–111)
Creatinine, Ser: 0.54 mg/dL (ref 0.44–1.00)
GFR, Estimated: >60 mL/min (ref >60)
Glucose, Bld: 138 mg/dL — ABNORMAL HIGH (ref 70–99)
Potassium: 3.6 mmol/L (ref 3.5–5.1)
Sodium: 139 mmol/L (ref 135–145)
Total Bilirubin: 0.4 mg/dL (ref 0.0–1.2)
Total Protein: 6.8 g/dL (ref 6.5–8.1)

## 2024-09-07 LAB — LIPASE, BLOOD: Lipase: 36 U/L (ref 11–51)

## 2024-09-07 MED ORDER — ALUM & MAG HYDROXIDE-SIMETH 200-200-20 MG/5ML PO SUSP
30.0000 mL | Freq: Once | ORAL | Status: AC
  Administered 2024-09-07: 30 mL via ORAL
  Filled 2024-09-07: qty 30

## 2024-09-07 MED ORDER — SUCRALFATE 1 G PO TABS: 1.0000 g | ORAL_TABLET | Freq: Three times a day (TID) | ORAL | 0 refills | Status: AC

## 2024-09-07 MED ORDER — DICYCLOMINE HCL 10 MG PO CAPS
10.0000 mg | ORAL_CAPSULE | Freq: Once | ORAL | Status: AC
  Administered 2024-09-07: 10 mg via ORAL
  Filled 2024-09-07: qty 1

## 2024-09-07 MED ORDER — OMEPRAZOLE 40 MG PO CPDR: 40.0000 mg | DELAYED_RELEASE_CAPSULE | Freq: Every day | ORAL | 0 refills | Status: AC

## 2024-09-07 NOTE — ED Provider Notes (Signed)
 [place] EMERGENCY DEPARTMENT AT [facility] Provider Note   CSN: [id number] Arrival date & time: 09/07/24  [id number]     Patient presents with: Abdominal Pain   K[name] is a 48 y.o. female who presents with concern for epigastric pain; hx of untreated gastritis. Patient feels this is the same. Nausea, V with NBNB emesis. Well-appearing.    HPI     Prior to Admission medications   Medication Sig Start Date End Date Taking? Authorizing Provider  omeprazole  (PRILOSEC) 40 MG capsule Take 1 capsule (40 mg total) by mouth daily. 09/07/24 10/07/24 Yes Rusti Arizmendi, Pleasant SAUNDERS, PA-C  sucralfate  (CARAFATE ) 1 g tablet Take 1 tablet (1 g total) by mouth 4 (four) times daily -  with meals and at bedtime. 09/07/24  Yes Infant Zink, Pleasant SAUNDERS, PA-C  Multiple Vitamins-Minerals (HAIR SKIN & NAILS ADVANCED) TABS Take by mouth.    [provider]    Allergies: Patient has no known allergies.    Review of Systems  HENT: Negative.    Gastrointestinal:  Positive for abdominal pain, nausea and vomiting. Negative for diarrhea.    Updated Vital Signs BP 94/82   Pulse 63   Temp (!) 97.4 F (36.3 C) (Oral)   Resp 18   Ht 5' 2 (1.575 m)   Wt 88.9 kg   LMP 08/24/2024   SpO2 100%   BMI 35.85 kg/m   Physical Exam Vitals and nursing note reviewed.  Constitutional:      Appearance: She is not ill-appearing or toxic-appearing.  HENT:     Head: Normocephalic and atraumatic.     Mouth/Throat:     Mouth: Mucous membranes are moist.     Pharynx: No oropharyngeal exudate or posterior oropharyngeal erythema.  Eyes:     General:        Right eye: No discharge.        Left eye: No discharge.     Conjunctiva/sclera: Conjunctivae normal.  Cardiovascular:     Rate and Rhythm: Normal rate and regular rhythm.     Pulses: Normal pulses.  Pulmonary:     Effort: Pulmonary effort is normal. No respiratory distress.     Breath sounds: Normal breath sounds. No wheezing or rales.   Abdominal:     General: Bowel sounds are normal. There is no distension.     Palpations: Abdomen is soft.     Tenderness: There is abdominal tenderness in the epigastric area and periumbilical area. There is no right CVA tenderness, left CVA tenderness, guarding or rebound.  Musculoskeletal:        General: No deformity.     Cervical back: Neck supple.  Skin:    General: Skin is warm and dry.     Capillary Refill: Capillary refill takes less than 2 seconds.  Neurological:     General: No focal deficit present.     Mental Status: She is alert. Mental status is at baseline.  Psychiatric:        Mood and Affect: Mood normal.     (all labs ordered are listed, but only abnormal results are displayed) Labs Reviewed  COMPREHENSIVE METABOLIC PANEL WITH GFR - Abnormal; Notable for the following components:      Result Value   Glucose, Bld 138 (*)    Calcium 8.5 (*)    Albumin 3.3 (*)    AST 42 (*)    All other components within normal limits  URINALYSIS, ROUTINE W REFLEX MICROSCOPIC - Abnormal; Notable for the following  components:   APPearance CLOUDY (*)    Leukocytes,Ua MODERATE (*)    Bacteria, UA RARE (*)    All other components within normal limits  LIPASE, BLOOD  CBC  HCG, SERUM, QUALITATIVE    EKG: None  Radiology: No results found.   Procedures   Medications Ordered in the ED  alum & mag hydroxide-simeth (MAALOX/MYLANTA) 200-200-20 MG/5ML suspension 30 mL (30 mLs Oral Given 09/07/24 0545)  dicyclomine  (BENTYL ) capsule 10 mg (10 mg Oral Given 09/07/24 0545)                                    Medical Decision Making 48 year old female with upper abdominal pain, nausea, vomiting, history of gastritis.  Mildly hypertensive on intake, vitals otherwise normal.  Mild upper abdominal tenderness to palpation without focality.  No CVAT.  Very well-appearing.    Amount and/or Complexity of Data Reviewed Labs:     Details: CBC unremarkable AST very mildly elevated to  42.  Lipase and UA unremarkable.  Not pregnant.  Risk OTC drugs. Prescription drug management.   Clinical concern for cardiac etiology is exceedingly low.  Patient was consistent with gastritis, patient very well-appearing, no imaging warranted at this time.  Shared decision making regarding role of imaging with patient's strong preference to avoid CT at this tim. GI cocktail administered, PPI initiated in the outpatient setting.  Clinical concern for emergent underlying etiology of the patient's symptoms that would warrant further ED workup and patient management is exceedingly low.  Dynasia voiced understanding of her medical evaluation and treatment plan. Each of their questions answered to their expressed satisfaction.  Return precautions were given.  Patient is well-appearing, stable, and was discharged in good condition.  This chart was dictated using voice recognition software, Dragon. Despite the best efforts of this provider to proofread and correct errors, errors may still occur which can change documentation meaning.      Final diagnoses:  Acute gastritis, presence of bleeding unspecified, unspecified gastritis type    ED Discharge Orders          Ordered    omeprazole  (PRILOSEC) 40 MG capsule  Daily        09/07/24 0524    sucralfate  (CARAFATE ) 1 g tablet  3 times daily with meals & bedtime        09/07/24 0524               Bobette Pleasant SAUNDERS, PA-C 09/07/24 [id number]    Darra Fonda MATSU, MD 09/07/24 [phone]

## 2024-09-07 NOTE — Discharge Instructions (Signed)
 You were seen in the ER today for your adominal pain. Your labs and physical exam were very reassuring. You likely are experiencing an exacerbation of your gastritis, for which you nave been prescribed the medications below. Please take them as prescribed, follow up with your primary care doctor, and return to the ER with any new severe symptoms.

## 2024-09-07 NOTE — ED Triage Notes (Signed)
 The pt is c/o abd pain since 0100am today  with nausea and vomiting  lmp last week
